# Patient Record
Sex: Female | Born: 1965 | Race: White | Hispanic: No | Marital: Married | Smoking: Never smoker
Health system: Southern US, Community
[De-identification: ages and names within clinical notes are randomized; demographics above are authoritative.]

## PROBLEM LIST (undated history)

## (undated) DIAGNOSIS — I82402 Acute embolism and thrombosis of unspecified deep veins of left lower extremity: Secondary | ICD-10-CM

## (undated) HISTORY — PX: BREAST EXCISIONAL BIOPSY: SUR124

## (undated) HISTORY — PX: THYROID CYST EXCISION: SHX2511

## (undated) HISTORY — PX: TONSILLECTOMY: SUR1361

## (undated) HISTORY — PX: BREAST BIOPSY: SHX20

## (undated) HISTORY — PX: CARPAL TUNNEL RELEASE: SHX101

## (undated) HISTORY — PX: DILATION AND CURETTAGE OF UTERUS: SHX78

## (undated) HISTORY — DX: Acute embolism and thrombosis of unspecified deep veins of left lower extremity: I82.402

---

## 2013-04-07 DIAGNOSIS — IMO0001 Reserved for inherently not codable concepts without codable children: Secondary | ICD-10-CM | POA: Insufficient documentation

## 2013-04-07 DIAGNOSIS — G43909 Migraine, unspecified, not intractable, without status migrainosus: Secondary | ICD-10-CM | POA: Insufficient documentation

## 2018-02-20 HISTORY — PX: VAGINAL HYSTERECTOMY: SUR661

## 2018-07-11 ENCOUNTER — Ambulatory Visit (INDEPENDENT_AMBULATORY_CARE_PROVIDER_SITE_OTHER): Payer: Managed Care, Other (non HMO)

## 2018-07-11 ENCOUNTER — Ambulatory Visit (INDEPENDENT_AMBULATORY_CARE_PROVIDER_SITE_OTHER): Payer: Managed Care, Other (non HMO) | Admitting: Family Medicine

## 2018-07-11 ENCOUNTER — Encounter: Payer: Self-pay | Admitting: Family Medicine

## 2018-07-11 VITALS — BP 138/58 | HR 71 | Ht 67.0 in | Wt 182.0 lb

## 2018-07-11 DIAGNOSIS — G2581 Restless legs syndrome: Secondary | ICD-10-CM

## 2018-07-11 DIAGNOSIS — M797 Fibromyalgia: Secondary | ICD-10-CM | POA: Diagnosis not present

## 2018-07-11 DIAGNOSIS — R0781 Pleurodynia: Secondary | ICD-10-CM

## 2018-07-11 DIAGNOSIS — G43109 Migraine with aura, not intractable, without status migrainosus: Secondary | ICD-10-CM | POA: Diagnosis not present

## 2018-07-11 DIAGNOSIS — A6000 Herpesviral infection of urogenital system, unspecified: Secondary | ICD-10-CM | POA: Insufficient documentation

## 2018-07-11 MED ORDER — ELETRIPTAN HYDROBROMIDE 40 MG PO TABS: 40.0000 mg | ORAL_TABLET | ORAL | 99 refills | Status: DC | PRN

## 2018-07-11 NOTE — Progress Notes (Signed)
Subjective:    Patient ID: Denise Ramirez, female    DOB: 21-Jul-1966, 52 y.o.   MRN: [id number]  HPI 52 yo female is here to estab care.   She has a history of fibromyalgia and ocular migraines.  In fact since moving back to [place] her ocular my back grains have been very poorly controlled.  She has been on lamotrigine 50 mg twice a day for years and just feels like she is at the point where she is not sure that is actually effective.  She has tried some other medications in the past as well.  More recently she is been on several rounds of prednisone tapers for her migraines.  She does use Relpax as needed and says that it works most of the time.  She does need a refill as her current prescription has expired.  She also has a diagnosis of fibromyalgia and is currently on duloxetine 20 mg.  She is taken up to 60 mg previously.  Her husband who is here with her today said that he felt like she felt better when she was taking the 30 mg tab.  She also has some chronic left leg pain.  She actually had an injury to her left leg and after being in a support brace for a couple of days she actually developed a DVT.  She was on birth control at that time.  She was then on Coumadin for 6 months and then came off.  Her contraceptives were discontinued and she never restarted them.  She has never had a recurrent DVT since then.  But unfortunately because of the DVT she never went underwent physical therapy and has had some chronic leg pain since then.    She also has a history of restless leg syndrome and says it always seems to be aggravated more in her left leg compared to her right.  She also has a genital herpes history and says that she rarely breaks out but does use valacyclovir when needed.  She is not on daily prophylaxis.  She also was lifting a table at work and was bending and twisting all at the same time.  She now has right anterior lower rib pain.  She felt a pop when it first occurred  and thinks she may have fractured a rib it feels similar to when she had had a rib fracture previously.  Did happen while at work.  She does have pain with a deep breath but does not feel short of breath in general.   She does report that she is up-to-date on most of her preventative care but does not remember the exact dates.  We should be able to get that information from her stroke medical records which she will have forwarded to our office.  Review of Systems  BP (!) 138/58   Pulse 71   Ht 5\' 7"  (1.702 m)   Wt 182 lb (82.6 kg)   SpO2 100%   BMI 28.51 kg/m     Allergies  Allergen Reactions  . Morphine And Related Nausea And Vomiting    Past Medical History:  Diagnosis Date  . 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 CARPAL TUNNEL RELEASE Right   . DILATION AND CURETTAGE OF UTERUS     x 3  . THYROID CYST EXCISION    . TONSILLECTOMY  age 52  . VAGINAL HYSTERECTOMY  02/2018    Social History   Socioeconomic History  . Marital status: Not on file    Spouse name: Not on file  . Number of children: Not on file  . Years of education: Not on file  . Highest education level: Not on file  Occupational History  . Occupation: Occupational hygienistetail Manager  Social Needs  . Financial resource strain: Not on file  . Food insecurity:    Worry: Not on file    Inability: Not on file  . Transportation needs:    Medical: Not on file    Non-medical: Not on file  Tobacco Use  . Smoking status: Never Smoker  . Smokeless tobacco: Never Used  Substance and Sexual Activity  . Alcohol use: Yes  . Drug use: Never  . Sexual activity: Yes    Partners: Male  Lifestyle  . Physical activity:    Days per week: Not on file    Minutes per session: Not on file  . Stress: Not on file  Relationships  . Social connections:    Talks on phone: Not on file    Gets together: Not on file    Attends religious service: Not on file    Active member of  club or organization: Not on file    Attends meetings of clubs or organizations: Not on file    Relationship status: Not on file  . Intimate partner violence:    Fear of current or ex partner: Not on file    Emotionally abused: Not on file    Physically abused: Not on file    Forced sexual activity: Not on file  Other Topics Concern  . Not on file  Social History Narrative  . Not on file    Family History  Adopted: Yes    Outpatient Encounter Medications as of 07/11/2018  Medication Sig  . acetaminophen (TYLENOL) 500 MG tablet Take 500 mg by mouth as needed.  . DULoxetine (CYMBALTA) 20 MG capsule Take 1 capsule by mouth daily.  Marland Kitchen. eletriptan (RELPAX) 40 MG tablet Take 1 tablet (40 mg total) by mouth as needed for migraine or headache. May repeat in 2 hours if headache persists or recurs.  . Ibuprofen 200 MG CAPS Take by mouth as needed.  . lamoTRIgine (LAMICTAL) 25 MG tablet Take 50 mg by mouth 2 (two) times daily.  . Multiple Vitamin (MULTIVITAMIN) tablet Take 1 tablet by mouth daily.  . pantoprazole (PROTONIX) 20 MG tablet Take 20 mg by mouth 2 (two) times daily.  . Probiotic Product (PROBIOTIC PO) Take by mouth.  . valACYclovir (VALTREX) 1000 MG tablet Take 1,000 mg by mouth as needed.  . [DISCONTINUED] Cholecalciferol (VITAMIN D PO) Take by mouth.  . [DISCONTINUED] Magnesium 500 MG TABS Take 500 mg by mouth 2 (two) times daily.  . [DISCONTINUED] riboflavin (VITAMIN B-2) 100 MG TABS tablet Take by mouth.   No facility-administered encounter medications on file as of 07/11/2018.           Objective:   Physical Exam  Constitutional: She is oriented to person, place, and time. She appears well-developed and well-nourished.  HENT:  Head: Normocephalic and atraumatic.  Eyes: Conjunctivae and EOM are normal.  Cardiovascular: Normal rate, regular rhythm and normal heart sounds.  Pulmonary/Chest: Effort normal and breath sounds normal.  Abdominal:  He does have some  significant tenderness just right under the breast on the right anterior ribs.  And are just a little bit laterally as well.  Neurological: She  is alert and oriented to person, place, and time.  Skin: Skin is warm and dry. No pallor.  Psychiatric: She has a normal mood and affect. Her behavior is normal.  Vitals reviewed.         Assessment & Plan:  Ocular migraines-we will place referral to neurology.  I did go ahead and refill her Relpax today.  Did get some benefit from Botox injections in the past.  The only something she could revisit with neurology.  She did not continue them because of the cost but she did notice some benefit.  Fibromyalgia-we will continue with Cymbalta 20 mg.  Though certainly if at any point she wants to bump her dose up she certainly can.  We discussed in the studies that often reduce pain by about 30% but did not completely eliminate symptoms.  Chronic left leg pain-offered to refer her to 1 of our sports medicine providers for further work-up and treatment options.  Restless leg syndrome-up in the future I will be happy to discuss treatment options.  It sounds like she may have actually tried gabapentin at one time but she might be a good candidate for ropinirole and we discussed that today.  Genital herpes-she does use Relpax as needed.  Anterior rib pain-discussed that if it is a rib fracture that there is not a lot that we can do.  It can take up to 12 weeks to heal.  Because the injury did occur at work she would like to get an x-ray just to confirm.

## 2018-07-11 NOTE — Progress Notes (Signed)
NEUROLOGY CONSULTATION NOTE  Denise Ramirez MRN: [id number] DOB: [birth year]  Referring provider: Nani Gasser, MD Primary care provider: Nani Gasser, MD  Reason for consult:  migraines  HISTORY OF PRESENT ILLNESS: Denise Ramirez is a 52 year old born left-handed female with fibromyalgia and history of left leg DVT who presents for migraines.  History supplemented by referring provider's note.  Onset:  Childhood/toddler Location:  holocephalic Quality:  Normally starts pounding then tender Intensity:  Severe.  She denies new headache, thunderclap headache or severe headache that wakes her from sleep. Aura:  Squiggly lines with peripheral vision loss with pinpoint scotoma, lasts up to 30 minutes followed by headache Prodrome:  no Postdrome:  Hangover effect for 2 days Associated symptoms:  Nausea, vomiting, photophobia, phonophobia, nose runs, paresthesias in face and arms.  She denies associated , unilateral weakness. Duration:  Wakes up with them and resolves in 2-3 hours and returns in afternoon.  Sometimes intractable up to 10 days Frequency:  15 migraine days a month, daily headaches She has intractable migraines Frequency of abortive medication: ibuprofen daily Triggers:  Bright sunlight Exacerbating factors:  Sleep deprivation, too much sleep, light, sounds, used to be menstrual Relieving factors:  Red wine, sometimes eating sweets, eye pillow, cold wash cloth, needs to lay down to the left. Activity:  Cannot function She has prior history of menstrual migraines and exertional headaches.  Regimen:  First thing in morning takes coffee and ibuprofen 400mg .  Then at lunch, will take 2 Tylenol (not effective).  Tries not to take Denise Ramirez at work due to drowsiness.  She will wait until home to take and then goes to sleep. Current NSAIDS:  Ibuprofen 400mg   Current analgesics:  Tylenol Current triptans:  Denise Ramirez 40mg  Current ergotamine: no Current  anti-emetic: no Current muscle relaxants:  no Current anti-anxiolytic:  no Current sleep aide:  no Current Antihypertensive medications:  no Current Antidepressant medications:  Cymbalta 20mg  Current Anticonvulsant medications:  Denise Ramirez 50mg  twice daily Current anti-CGRP:  no Current Vitamins/Herbal/Supplements:  MVI Current Antihistamines/Decongestants:  no Other therapy:  Daith piercing Hormone/birth control:  no  Past NSAIDS:  naproxen Past analgesics:  Fioricet, Excedrin, Excedrin Migraine, tramadol Past abortive triptans:  Sumatriptan tablet, sumatriptan [place], Zomig NS, Maxalt Past abortive ergotamine:  Migranal Past muscle relaxants:  Flexeril Past anti-emetic:  Promethazine Past antihypertensive medications:  propranolol Past antidepressant medications:  Amitriptyline, nortriptyline Past anticonvulsant medications:  Topiramate, Depakote, Keppra Past anti-CGRP:  no Past vitamins/Herbal/Supplements:  Magnesium 500mg , riboflavin 100mg  Past antihistamines/decongestants:  no Other past therapies:  Botox (1 round, then she moved out of area), prednisone tapers for intractable headache  Caffeine:  2 cups of coffee in AM Alcohol:  Occasional wine Smoker:  no Diet:  At least 8 16.5 oz bottles water daily, Caffeine-free Coke Exercise:  2 days a week Depression:  no; Anxiety:  no Other pain:  Fibromyalgia, neck pain Sleep hygiene:  Good.  She used to snore and was told she may have had sleep apnea but she lost 50 lbs before testing.  3 pm she starts feeling sleepy. Family history of headache: Adopted.  PAST MEDICAL HISTORY: Past Medical History:  Diagnosis Date  . Left leg DVT (HCC)    trauma and OCP    PAST SURGICAL HISTORY: Past Surgical History:  Procedure Laterality Date  . CARPAL TUNNEL RELEASE Right   . DILATION AND CURETTAGE OF UTERUS     x 3  . THYROID CYST EXCISION    . TONSILLECTOMY  age 52  . VAGINAL HYSTERECTOMY  02/2018    MEDICATIONS: Current  Outpatient Medications on File Prior to Visit  Medication Sig Dispense Refill  . acetaminophen (TYLENOL) 500 MG tablet Take 500 mg by mouth as needed.    . Denise Ramirez (CYMBALTA) 20 MG capsule Take 1 capsule by mouth daily.    Marland Kitchen. eletriptan (Denise Ramirez) 40 MG tablet Take 1 tablet (40 mg total) by mouth as needed for migraine or headache. May repeat in 2 hours if headache persists or recurs. 10 tablet PRN  . Ibuprofen 200 MG CAPS Take by mouth as needed.    . Denise Ramirez (LAMICTAL) 25 MG tablet Take 50 mg by mouth 2 (two) times daily.    . Multiple Vitamin (MULTIVITAMIN) tablet Take 1 tablet by mouth daily.    . pantoprazole (PROTONIX) 20 MG tablet Take 20 mg by mouth 2 (two) times daily.    . Probiotic Product (PROBIOTIC PO) Take by mouth.    . valACYclovir (VALTREX) 1000 MG tablet Take 1,000 mg by mouth as needed.     No current facility-administered medications on file prior to visit.     ALLERGIES: Allergies  Allergen Reactions  . Morphine And Related Nausea And Vomiting    FAMILY HISTORY: Family History  Adopted: Yes   SOCIAL HISTORY: Social History   Socioeconomic History  . Marital status: Not on file    Spouse name: Not on file  . Number of children: Not on file  . Years of education: Not on file  . Highest education level: Not on file  Occupational History  . Occupation: Occupational hygienistetail Manager  Social Needs  . Financial resource strain: Not on file  . Food insecurity:    Worry: Not on file    Inability: Not on file  . Transportation needs:    Medical: Not on file    Non-medical: Not on file  Tobacco Use  . Smoking status: Never Smoker  . Smokeless tobacco: Never Used  Substance and Sexual Activity  . Alcohol use: Yes  . Drug use: Never  . Sexual activity: Yes    Partners: Male  Lifestyle  . Physical activity:    Days per week: Not on file    Minutes per session: Not on file  . Stress: Not on file  Relationships  . Social connections:    Talks on phone: Not on file     Gets together: Not on file    Attends religious service: Not on file    Active member of club or organization: Not on file    Attends meetings of clubs or organizations: Not on file    Relationship status: Not on file  . Intimate partner violence:    Fear of current or ex partner: Not on file    Emotionally abused: Not on file    Physically abused: Not on file    Forced sexual activity: Not on file  Other Topics Concern  . Not on file  Social History Narrative  . Not on file    REVIEW OF SYSTEMS: Constitutional: No fevers, chills, or sweats, no generalized fatigue, change in appetite Eyes: No visual changes, double vision, eye pain Ear, nose and throat: No hearing loss, ear pain, nasal congestion, sore throat Cardiovascular: No chest pain, palpitations Respiratory:  No shortness of breath at rest or with exertion, wheezes GastrointestinaI: No nausea, vomiting, diarrhea, abdominal pain, fecal incontinence Genitourinary:  No dysuria, urinary retention or frequency Musculoskeletal:  Neck pain, back pain Integumentary: No rash,  pruritus, skin lesions Neurological: as above Psychiatric: No depression, anxiety Endocrine: No palpitations, fatigue, diaphoresis, mood swings, change in appetite, change in weight, increased thirst Hematologic/Lymphatic:  No purpura, petechiae. Allergic/Immunologic: no itchy/runny eyes, nasal congestion, recent allergic reactions, rashes  PHYSICAL EXAM: Blood pressure 122/84, pulse 64, height 5\' 7"  (1.702 m), weight 182 lb (82.6 kg), SpO2 99 %. General: No acute distress.  Patient appears well-groomed.   Head:  Normocephalic/atraumatic Eyes:  fundi examined but not visualized Neck: supple, mild paraspinal tenderness, full range of motion Back: No paraspinal tenderness Heart: regular rate and rhythm Lungs: Clear to auscultation bilaterally. Vascular: No carotid bruits. Neurological Exam: Mental status: alert and oriented to person, place, and time,  recent and remote memory intact, fund of knowledge intact, attention and concentration intact, speech fluent and not dysarthric, language intact. Cranial nerves: CN I: not tested CN II: pupils equal, round and reactive to light, visual fields intact CN III, IV, VI:  full range of motion, no nystagmus, no ptosis CN V: facial sensation intact CN VII: upper and lower face symmetric CN VIII: hearing intact CN IX, X: gag intact, uvula midline CN XI: sternocleidomastoid and trapezius muscles intact CN XII: tongue midline Bulk & Tone: normal, no fasciculations. Motor:  5/5 throughout  Sensation:  temperature and vibration sensation intact.   Deep Tendon Reflexes:  2+ throughout, toes downgoing.   Finger to nose testing:  Without dysmetria.  Heel to shin:  Without dysmetria.  Gait:  Normal station and stride.  Romberg negative.  IMPRESSION: Chronic migraine with aura, intractable Medication-overuse headache  PLAN: 1.  Start Aimovig 70mg  monthly (first injection in office) 2.  For abortive therapy, will try Cambia.  Denise Ramirez/Tylenol as next line. 3.  Stop ibuprofen:  Limit use of pain relievers to no more than 2 days out of week to prevent risk of rebound or medication-overuse headache. 4.  Keep headache diary 5.  Will taper off of Denise Ramirez as scheduled in AVS. 6.  Exercise, hydrate, caffeine cessation 7.  Follow up in 3 to 4 months.  Thank you for allowing me to take part in the care of this patient.  45 minutes spent face to face with patient, over 50% spent discussing management  Shon Millet, DO  CC: Denise Gasser, MD

## 2018-07-12 ENCOUNTER — Telehealth: Payer: Self-pay | Admitting: *Deleted

## 2018-07-12 ENCOUNTER — Encounter: Payer: Self-pay | Admitting: Neurology

## 2018-07-12 ENCOUNTER — Ambulatory Visit: Payer: Managed Care, Other (non HMO) | Admitting: Neurology

## 2018-07-12 VITALS — BP 122/84 | HR 64 | Ht 67.0 in | Wt 182.0 lb

## 2018-07-12 DIAGNOSIS — G4440 Drug-induced headache, not elsewhere classified, not intractable: Secondary | ICD-10-CM | POA: Diagnosis not present

## 2018-07-12 DIAGNOSIS — G43109 Migraine with aura, not intractable, without status migrainosus: Secondary | ICD-10-CM | POA: Diagnosis not present

## 2018-07-12 MED ORDER — ERENUMAB-AOOE 70 MG/ML ~~LOC~~ SOAJ: 70.0000 mg | SUBCUTANEOUS | 0 refills | Status: DC

## 2018-07-12 MED ORDER — DICLOFENAC POTASSIUM(MIGRAINE) 50 MG PO PACK: 50.0000 mg | PACK | ORAL | 0 refills | Status: DC

## 2018-07-12 NOTE — Patient Instructions (Signed)
Migraine Recommendations: 1.  Start Aimovig 70mg  monthly.   2.  Take Cambia as directed to treat acute headache.May use Tylenol and Relpax as next line if needed. 3.  STOP IBUPROFEN.  Limit use of pain relievers to no more than 2 days out of the week.  These medications include acetaminophen, ibuprofen, triptans and narcotics.  This will help reduce risk of rebound headaches. 4.  Be aware of common food triggers such as processed sweets, processed foods with nitrites (such as deli meat, hot dogs, sausages), foods with MSG, alcohol (such as wine), chocolate, certain cheeses, certain fruits (dried fruits, bananas, some citrus fruit), vinegar, diet soda. 4.  Avoid caffeine 5.  Routine exercise 6.  Proper sleep hygiene 7.  Stay adequately hydrated with water 8.  Keep a headache diary. 9.  Maintain proper stress management. 10.  Do not skip meals. 11.  Consider supplements:  Magnesium citrate 400mg  to 600mg  daily, riboflavin 400mg , Coenzyme Q 10 100mg  three times daily 12.  We will taper off of lamotrigine:   Take 25mg  twice daily for 2 weeks   Then 25mg  at bedtime for 2 weeks   Then stop 13.  Follow up in 3 to 4 months.

## 2018-07-12 NOTE — Telephone Encounter (Signed)
Spoke w/pt she stated that she was unable to sign in to her my chart account and when she contacted the help desk they advised her to call our office. She was on her way to an appt I told her that I would copy and paste the code that is in her account and when she calls back she can get this and try it. The code is valid until 08/25/2018.   activation code: [email or web address]  .Denise Ramirez, Denise Ramirez Lynetta, CMA

## 2018-08-08 ENCOUNTER — Other Ambulatory Visit: Payer: Self-pay

## 2018-08-08 MED ORDER — ERENUMAB-AOOE 70 MG/ML ~~LOC~~ SOAJ: 70.0000 mg | SUBCUTANEOUS | 11 refills | Status: DC

## 2018-08-09 NOTE — Progress Notes (Signed)
Submitted on cover my meds. Aimovig is not covered Ajovy and Emgality are covered. Pt was provided with a co-pay card when sample was given. Called Pt and advised her.

## 2018-08-26 ENCOUNTER — Telehealth: Payer: Self-pay

## 2018-08-26 NOTE — Telephone Encounter (Signed)
Rcvd call from Sue Lush from [employer] ([employer]) [phone], [employer] was denied for this Pt. I will research other options and call her back

## 2018-08-27 ENCOUNTER — Other Ambulatory Visit: Payer: Self-pay | Admitting: *Deleted

## 2018-08-27 MED ORDER — DULOXETINE HCL 20 MG PO CPEP: 20.0000 mg | ORAL_CAPSULE | Freq: Every day | ORAL | 0 refills | Status: DC

## 2018-08-29 ENCOUNTER — Telehealth: Payer: Self-pay | Admitting: Neurology

## 2018-08-29 NOTE — Telephone Encounter (Signed)
[employer] called in wanting to check on what needed to be done next with steps for this patient. Please call her back at [phone] Thanks!

## 2018-10-11 NOTE — Progress Notes (Signed)
Attempted to initiate PA for pt's Aimovig and received the following message.    The patient's drug benefit plan provides coverage for other drugs which may be considered for treating your patient. Can your patient's treatment be switched to a formulary drug? [If yes, then provide your patient with a new prescription for a preferred product: Ajovy or Emgality.] Available Formulary Alternatives: Ajovy or Emgality  No documentation of tried/failed/intolerence of preferred drug.    Dr. Everlena CooperJaffe - would you consider switching to preferred drug?

## 2018-10-14 MED ORDER — GALCANEZUMAB-GNLM 120 MG/ML ~~LOC~~ SOAJ: 1.0000 mL | SUBCUTANEOUS | 11 refills | Status: DC

## 2018-10-14 NOTE — Addendum Note (Signed)
Addended by: Horatio PelOSTELLO, MEAGEN on: 10/14/2018 11:24 AM   Modules accepted: Orders

## 2018-10-14 NOTE — Progress Notes (Signed)
Emgality 120mg /mL #1 with 11 refills Sig = Inject 1 mLSUBQ every 30 (thirty) days.  [facility] [place]

## 2018-10-14 NOTE — Progress Notes (Signed)
We can try Emgality.

## 2018-11-06 ENCOUNTER — Ambulatory Visit: Payer: Managed Care, Other (non HMO) | Admitting: Neurology

## 2018-11-28 ENCOUNTER — Other Ambulatory Visit: Payer: Self-pay | Admitting: *Deleted

## 2018-11-28 DIAGNOSIS — M797 Fibromyalgia: Secondary | ICD-10-CM

## 2018-11-28 MED ORDER — DULOXETINE HCL 20 MG PO CPEP: 20.0000 mg | ORAL_CAPSULE | Freq: Every day | ORAL | 0 refills | Status: DC

## 2018-12-09 ENCOUNTER — Encounter: Payer: Self-pay | Admitting: Neurology

## 2019-02-22 ENCOUNTER — Other Ambulatory Visit: Payer: Self-pay | Admitting: Family Medicine

## 2019-02-22 DIAGNOSIS — M797 Fibromyalgia: Secondary | ICD-10-CM

## 2019-02-25 ENCOUNTER — Encounter: Payer: Self-pay | Admitting: Family Medicine

## 2019-02-25 ENCOUNTER — Telehealth (INDEPENDENT_AMBULATORY_CARE_PROVIDER_SITE_OTHER): Payer: Managed Care, Other (non HMO) | Admitting: Family Medicine

## 2019-02-25 VITALS — BP 110/60 | HR 72 | Ht 67.0 in | Wt 182.0 lb

## 2019-02-25 DIAGNOSIS — F5102 Adjustment insomnia: Secondary | ICD-10-CM

## 2019-02-25 DIAGNOSIS — M797 Fibromyalgia: Secondary | ICD-10-CM

## 2019-02-25 DIAGNOSIS — G43809 Other migraine, not intractable, without status migrainosus: Secondary | ICD-10-CM | POA: Diagnosis not present

## 2019-02-25 MED ORDER — CYCLOBENZAPRINE HCL 5 MG PO TABS: 2.5000 mg | ORAL_TABLET | Freq: Every evening | ORAL | 1 refills | Status: DC | PRN

## 2019-02-25 MED ORDER — DULOXETINE HCL 20 MG PO CPEP: 20.0000 mg | ORAL_CAPSULE | Freq: Every day | ORAL | 2 refills | Status: DC

## 2019-02-25 NOTE — Progress Notes (Signed)
Virtual Visit via Video Note  I connected with Denise Ramirez on 02/25/19 at  9:50 AM EDT by a video enabled telemedicine application and verified that I am speaking with the correct person using two identifiers.   I discussed the limitations of evaluation and management by telemedicine and the availability of in person appointments. The patient expressed understanding and agreed to proceed.  Patient was at work and I was in the office for this visit.  Subjective:    CC: Fibro f/u   HPI: Follow-up fibromyalgia -she is currently on duloxetine 20 mg daily.  She does feel like it works well as long as she does not miss a dose but wanted to see if there are any other options. Was on gabapentin years ago for RLS and didn't really work for her. She did take Lyrica at one point as well. Had a flare as well.  Says her schedule was off and feels like that was triggering.  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  They are not opened to the public yet.  Her migraines have been flaring last week as well. Says her sleep has been off and feels that has been triggering her migraines.  Overall though she is done fantastic since being on Aimovig.  She follows with Dr. Everlena Cooper with neurology.  Has been having some left leg pain again where she had a blood clot years ago.  Always stay sore but has been keeping her night lately.   Past medical history, Surgical history, Family history not pertinant except as noted below, Social history, Allergies, and medications have been entered into the medical record, reviewed, and corrections made.   Review of Systems: No fevers, chills, night sweats, weight loss, chest pain, or shortness of breath.   Objective:    General: Speaking clearly in complete sentences without any shortness of breath.  Alert and oriented x3.  Normal judgment. No apparent acute distress.  Well-groomed.    Impression and Recommendations:    Fibromyalgia  -discussed options and additional treatment options it sounds like she is Denise Ramirez tried several in the past.  She actually really does do well with Cymbalta overall she just does not like how it makes her feel when she misses a dose.  We will continue with Cymbalta 20 mg.  She did not want to adjust the dose.  We did discussed maybe a trial of a low-dose muscle relaxer at bedtime to help with some of those more sleepless nights to just use as needed.  She said she took Flexeril years ago and does not remember any negative side effects.  Will send over prescription to the pharmacy.  Migraine HA - she sees Dr. Everlena Cooper and is on Aimovig.  Overall she is been doing really well on the Aimovig.  Has had some recent flares over the last week with her migraines but she thinks secondary to sleep issues.  She says she is also interested in maybe trying a low-carb diet though she is already a vegetarian.  Insomnia-I think it is temporary probably because of schedule change in stressors with COVID.  Again we will try low-dose of Flexeril because of her fibro-at night to see if this is helpful.      I discussed the assessment and treatment plan with the patient. The patient was provided an opportunity to ask questions and all were answered. The patient agreed with the plan and demonstrated an understanding of the instructions.  The patient was advised to call back or seek an in-person evaluation if the symptoms worsen or if the condition fails to improve as anticipated.   [name], MD

## 2019-02-25 NOTE — Progress Notes (Signed)
Pt wanted to know if there was anything else that she can take for her Fibromyalgia other than the Cymbalta? She does well on this as long as she doesn't miss any doses. But wanted to know Dr. Shelah Lewandowsky thoughts about this.Marland KitchenMarland KitchenHeath Gold, CMA

## 2019-04-08 NOTE — Progress Notes (Signed)
Aimovig 70mg  approved through 04/07/2020.

## 2019-04-08 NOTE — Progress Notes (Signed)
Initiated on Cover My MedsKey: Katy Fitch   If Tommie Sams has not responded to your request within 24 hours, contact [place] at [phone]

## 2019-08-08 ENCOUNTER — Other Ambulatory Visit: Payer: Self-pay

## 2019-08-08 ENCOUNTER — Telehealth: Payer: Self-pay | Admitting: Neurology

## 2019-08-08 ENCOUNTER — Other Ambulatory Visit: Payer: Self-pay | Admitting: Neurology

## 2019-08-08 NOTE — Telephone Encounter (Signed)
Requested Prescriptions   Pending Prescriptions Disp Refills  . AIMOVIG 70 MG/ML SOAJ [Pharmacy Med Name: AIMOVIG 70 MG/ML AUTOINJECTOR]  7    Sig: INJECT 70 MG INTO THE SKIN EVERY 30 (THIRTY) DAYS.   Rx last filled:08/08/18 #1 11 REFILLS  Pt last seen:07/12/18  Follow up appt scheduled:NONE  DENIED PATIENT NEEDS APPT

## 2019-08-08 NOTE — Telephone Encounter (Signed)
Patient called to make a follow up appointment and to get a refill on her Aimovig medication. She uses [facility]. Thanks

## 2019-08-08 NOTE — Telephone Encounter (Signed)
Patient last visit was 08/08/18 and Aimovig 70mg  was ordered. She called for a refill and needed to make an appt which she did for next Friday 10/23.  Called patient back and left message (ok per DPR) to see if she is able to wait until appt with MD on Friday for Aimovig in case he wants to increase the dosage. Left on the message if she has to have the med next week prior to appt to call us back and will ask MD if ok for one more 70mg  injection prior to [facility] appt.

## 2019-08-11 ENCOUNTER — Other Ambulatory Visit: Payer: Self-pay | Admitting: Neurology

## 2019-08-11 MED ORDER — AIMOVIG 70 MG/ML ~~LOC~~ SOAJ: 70.0000 mg | SUBCUTANEOUS | 0 refills | Status: DC

## 2019-08-11 NOTE — Telephone Encounter (Signed)
Requested Prescriptions   Pending Prescriptions Disp Refills  . Erenumab-aooe (AIMOVIG) 70 MG/ML SOAJ 1 pen 11    Sig: Inject 70 mg into the skin every 30 (thirty) days.   Rx last filled:08/08/18 #1 11 refills  Pt last seen: 07/12/18  Follow up appt scheduled:08/15/19

## 2019-08-11 NOTE — Telephone Encounter (Signed)
Pt last seen over a year ago appt coming up 1 pen sent to pharmacy until patient is seen in office.

## 2019-08-11 NOTE — Telephone Encounter (Signed)
Patient called and requested Aimovig 70 MG be called into the pharmacy as soon as possible. She said she is due to take her next injection tomorrow, 08/12/19. She does not want to wait until her appointment on Friday, 08/15/19.  [facility]  See telephone note from 08/08/19 for more information.

## 2019-08-12 ENCOUNTER — Telehealth: Payer: Self-pay | Admitting: Neurology

## 2019-08-12 NOTE — Telephone Encounter (Signed)
Left message with after hour service 08-12-19 @ 12:41 pm  Caller states that she needs RX sent to the pharmacy ASAP today she is needing her Aimovig today she left a message on Friday

## 2019-08-12 NOTE — Telephone Encounter (Signed)
Called patient she was informed that Rx was sent to pharmacy to contact pharmacy.

## 2019-08-12 NOTE — Progress Notes (Signed)
NEUROLOGY FOLLOW UP OFFICE NOTE  Denise Ramirez [id number]  HISTORY OF PRESENT ILLNESS: Denise Ramirez is a 53 year old born left-handed female with fibromyalgia and history of left leg DVT who follows up for chronic migraine.  UPDATE: Wakes up with migraine.  She still uses Migranal. Causes burning for 15 minutes but migraine aborts quickly. Intensity:  severe Duration:  1/2 to 1 day Frequency:  3 days a month Rescue protocol:  2 ibuprofen with coffee; after 60 to 90 minutes will take Migranal (usually breaks immediately).  Will take ibuprofen an hour later (rarely) Frequency of abortive medication: 3 days a week Current NSAIDS:  none Current analgesics:  Tylenol Current triptans:  Relpax 40mg  Current ergotamine: Migranal Current anti-emetic: no Current muscle relaxants:  Flexeril Current anti-anxiolytic:  no Current sleep aide:  no Current Antihypertensive medications:  no Current Antidepressant medications:  Cymbalta 20mg  Current Anticonvulsant medications:  Lamotrigine 50mg  twice daily Current anti-CGRP:   Aimovig 70 mg Current Vitamins/Herbal/Supplements:  MVI Current Antihistamines/Decongestants:  no Other therapy:  Daith piercing Hormone/birth control:  no  Caffeine:  2 cups of coffee in AM Alcohol:  Occasional wine Smoker:  no Diet:  At least 8 16.5 oz bottles water daily, Caffeine-free Coke Exercise:  2 days a week Depression:  no; Anxiety:  no Other pain:  Fibromyalgia, neck pain Sleep hygiene:  Good.  She used to snore and was told she may have had sleep apnea but she lost 50 lbs before testing.  3 pm she starts feeling sleepy.  HISTORY: Onset: Childhood/toddler Location:  holocephalic Quality:  Normally starts pounding then tender Initial intensity:  Severe.  She denies new headache, thunderclap headache or severe headache that wakes her from sleep. Aura:  Squiggly lines with peripheral vision loss with pinpoint scotoma, lasts up to 30 minutes  followed by headache Prodrome:  no Postdrome:  Hangover effect for 2 days Associated symptoms: Nausea, vomiting, photophobia, phonophobia, nose runs, paresthesias in face and arms.  She denies associated , unilateral weakness. Initial duration:  Wakes up with them and resolves in 2-3 hours and returns in afternoon.  Sometimes intractable up to 10 days Initial Frequency:  15 migraine days a month, daily headaches She has intractable migraines Frequency of abortive medication: ibuprofen daily Triggers: Bright sunlight, sleep deprivation, formerly menses Relieving factors: Red wine, sometimes eating sweets, eye pillow, cold wash cloth, needs to lay down to the left. Activity:  Cannot function She has prior history of menstrual migraines and exertional headaches.   Past NSAIDS:  naproxen, ibuprofen, Cambia. Insurance denied Sprix NS Past analgesics:  Fioricet, Excedrin, Excedrin Migraine, tramadol Past abortive triptans:  Sumatriptan tablet, sumatriptan Fithian, Zomig NS, Maxalt Past abortive ergotamine:  none Past muscle relaxants:  Flexeril Past anti-emetic:  Promethazine Past antihypertensive medications:  propranolol Past antidepressant medications:  Amitriptyline, nortriptyline Past anticonvulsant medications:  Topiramate, Depakote, Keppra Past anti-CGRP:  no Past vitamins/Herbal/Supplements:  Magnesium 500mg , riboflavin 100mg  Past antihistamines/decongestants:  no Other past therapies:  Botox (1 round, then she moved out of area), prednisone tapers for intractable headache, coffee   Family history of headache: Adopted.  PAST MEDICAL HISTORY: Past Medical History:  Diagnosis Date  . Left leg DVT (HCC)    trauma and OCP    MEDICATIONS: Current Outpatient Medications on File Prior to Visit  Medication Sig Dispense Refill  . acetaminophen (TYLENOL) 500 MG tablet Take 500 mg by mouth as needed.    . cyclobenzaprine (FLEXERIL) 5 MG tablet Take 0.5-1 tablets (2.5-5  mg total) by  mouth at bedtime as needed for muscle spasms. 30 tablet 1  . DULoxetine (CYMBALTA) 20 MG capsule Take 1 capsule (20 mg total) by mouth daily. 90 capsule 2  . eletriptan (RELPAX) 40 MG tablet Take 1 tablet (40 mg total) by mouth as needed for migraine or headache. May repeat in 2 hours if headache persists or recurs. 10 tablet PRN  . Erenumab-aooe (AIMOVIG) 70 MG/ML SOAJ Inject 70 mg into the skin every 30 (thirty) days. 1 pen 0  . Ibuprofen 200 MG CAPS Take by mouth as needed.    . Multiple Vitamin (MULTIVITAMIN) tablet Take 1 tablet by mouth daily.    . pantoprazole (PROTONIX) 20 MG tablet Take 20 mg by mouth 2 (two) times daily.    . Probiotic Product (PROBIOTIC PO) Take by mouth.    . valACYclovir (VALTREX) 1000 MG tablet Take 1,000 mg by mouth as needed.     No current facility-administered medications on file prior to visit.     ALLERGIES: Allergies  Allergen Reactions  . Morphine And Related Nausea And Vomiting    FAMILY HISTORY: Family History  Adopted: Yes   SOCIAL HISTORY: Social History   Socioeconomic History  . Marital status: Unknown    Spouse name: Matt  . Number of children: 3  . Years of education: Not on file  . Highest education level: Associate degree: academic program  Occupational History  . Occupation: Occupational hygienist  Social Needs  . Financial resource strain: Not on file  . Food insecurity    Worry: Not on file    Inability: Not on file  . Transportation needs    Medical: Not on file    Non-medical: Not on file  Tobacco Use  . Smoking status: Never Smoker  . Smokeless tobacco: Never Used  Substance and Sexual Activity  . Alcohol use: Yes  . Drug use: Never  . Sexual activity: Yes    Partners: Male  Lifestyle  . Physical activity    Days per week: Not on file    Minutes per session: Not on file  . Stress: Not on file  Relationships  . Social Musician on phone: Not on file    Gets together: Not on file    Attends religious  service: Not on file    Active member of club or organization: Not on file    Attends meetings of clubs or organizations: Not on file    Relationship status: Not on file  . Intimate partner violence    Fear of current or ex partner: Not on file    Emotionally abused: Not on file    Physically abused: Not on file    Forced sexual activity: Not on file  Other Topics Concern  . Not on file  Social History Narrative   Patient was born left-handed. She was trained in parochial school to use her right hand. She uses her left hand to eat. She lives with her husband, they are closing on a 2 story house, staying with relatives for the time being. She drinks  2 cups of coffee a day, and rare soda. She participates in weight training twice a week.    REVIEW OF SYSTEMS: Constitutional: No fevers, chills, or sweats, no generalized fatigue, change in appetite Eyes: No visual changes, double vision, eye pain Ear, nose and throat: No hearing loss, ear pain, nasal congestion, sore throat Cardiovascular: No chest pain, palpitations Respiratory:  No shortness of  breath at rest or with exertion, wheezes GastrointestinaI: No nausea, vomiting, diarrhea, abdominal pain, fecal incontinence Genitourinary:  No dysuria, urinary retention or frequency Musculoskeletal:  No neck pain, back pain Integumentary: No rash, pruritus, skin lesions Neurological: as above Psychiatric: No depression, insomnia, anxiety Endocrine: No palpitations, fatigue, diaphoresis, mood swings, change in appetite, change in weight, increased thirst Hematologic/Lymphatic:  No purpura, petechiae. Allergic/Immunologic: no itchy/runny eyes, nasal congestion, recent allergic reactions, rashes  PHYSICAL EXAM: Blood pressure (!) 111/59, pulse 72, height 5\' 8"  (1.727 m), weight 194 lb (88 kg), SpO2 97 %. General: No acute distress.  Patient appears well-groomed.   Head:  Normocephalic/atraumatic Eyes:  Fundi examined but not visualized Neck:  supple, no paraspinal tenderness, full range of motion Heart:  Regular rate and rhythm Lungs:  Clear to auscultation bilaterally Back: No paraspinal tenderness Neurological Exam: alert and oriented to person, place, and time. Attention span and concentration intact, recent and remote memory intact, fund of knowledge intact.  Speech fluent and not dysarthric, language intact.  CN II-XII intact. Bulk and tone normal, muscle strength 5/5 throughout.  Sensation to light touch, temperature and vibration intact.  Deep tendon reflexes 2+ throughout, toes downgoing.  Finger to nose and heel to shin testing intact.  Gait normal, Romberg negative.  IMPRESSION: Migraine without aura, without status migrainosus, not intractable.  Improved.  PLAN: 1.  For preventative management, Aimovig 70mg  monthly 2.  For abortive therapy, Migranal and/or ibuprofen.  Instructed her to stop eletriptan and explained that ergotamines and triptans are not to be taken within 24 hours of each other.  We went over instructions on how to administer Migranal to minimize discomfort. 3.  Limit use of pain relievers to no more than 2 days out of week to prevent risk of rebound or medication-overuse headache. 4.  Keep headache diary 5.  Exercise, hydration, caffeine cessation, sleep hygiene, monitor for and avoid triggers 6.  Consider:  magnesium citrate 400mg  daily, riboflavin 400mg  daily, and coenzyme Q10 100mg  three times daily 7. Follow up one year.   Shon MilletAdam Trai Ells, DO  CC: Nani Gasseratherine Metheney, MD

## 2019-08-12 NOTE — Telephone Encounter (Signed)
Requested Prescriptions   Pending Prescriptions Disp Refills  . Erenumab-aooe (AIMOVIG) 70 MG/ML SOAJ 1 pen 0    Sig: Inject 70 mg into the skin every 30 (thirty) days.   Rx was sent to pharmacy on yesterday   Outpatient Medication Detail   Disp Refills Start End   Erenumab-aooe (AIMOVIG) 70 MG/ML SOAJ 1 pen 0 08/11/2019    Sig - Route: Inject 70 mg into the skin every 30 (thirty) days. - Subcutaneous   Sent to pharmacy as: [name] 70 MG/ML Solution Auto-injector   E-Prescribing Status: Receipt confirmed by pharmacy (08/11/2019 3:41 PM EDT)    [id number] [place] [place]. AT [place] #:  [id number]  Pharmacy Comments: --

## 2019-08-15 ENCOUNTER — Other Ambulatory Visit: Payer: Self-pay

## 2019-08-15 ENCOUNTER — Ambulatory Visit: Payer: Managed Care, Other (non HMO) | Admitting: Neurology

## 2019-08-15 ENCOUNTER — Encounter: Payer: Self-pay | Admitting: Neurology

## 2019-08-15 VITALS — BP 111/59 | HR 72 | Ht 68.0 in | Wt 194.0 lb

## 2019-08-15 DIAGNOSIS — G43009 Migraine without aura, not intractable, without status migrainosus: Secondary | ICD-10-CM

## 2019-08-15 NOTE — Patient Instructions (Signed)
1.  Continue Aimovig every month 2.  Stop the eletriptan.  When you get a migraine, use Migranal spray or ibuprofen 3.  Follow up in one year or as needed

## 2019-09-03 ENCOUNTER — Ambulatory Visit: Payer: Managed Care, Other (non HMO) | Admitting: Family Medicine

## 2019-09-04 ENCOUNTER — Other Ambulatory Visit: Payer: Self-pay

## 2019-09-04 ENCOUNTER — Ambulatory Visit (INDEPENDENT_AMBULATORY_CARE_PROVIDER_SITE_OTHER): Payer: Managed Care, Other (non HMO) | Admitting: Family Medicine

## 2019-09-04 ENCOUNTER — Encounter: Payer: Self-pay | Admitting: Family Medicine

## 2019-09-04 VITALS — BP 130/71 | HR 82 | Ht 68.0 in | Wt 192.0 lb

## 2019-09-04 DIAGNOSIS — M797 Fibromyalgia: Secondary | ICD-10-CM | POA: Diagnosis not present

## 2019-09-04 DIAGNOSIS — Z1231 Encounter for screening mammogram for malignant neoplasm of breast: Secondary | ICD-10-CM

## 2019-09-04 DIAGNOSIS — M545 Low back pain, unspecified: Secondary | ICD-10-CM | POA: Insufficient documentation

## 2019-09-04 DIAGNOSIS — Z79899 Other long term (current) drug therapy: Secondary | ICD-10-CM

## 2019-09-04 DIAGNOSIS — Z13220 Encounter for screening for lipoid disorders: Secondary | ICD-10-CM | POA: Diagnosis not present

## 2019-09-04 DIAGNOSIS — G43809 Other migraine, not intractable, without status migrainosus: Secondary | ICD-10-CM

## 2019-09-04 NOTE — Assessment & Plan Note (Signed)
He is actually doing really well on the Aimovig.  She has been on it for little over a year now and says that this is the best she is ever done with her migraines.  She follows with Dr. Tomi Likens.

## 2019-09-04 NOTE — Progress Notes (Signed)
Established Patient Office Visit  Subjective:  Patient ID: Denise Ramirez, female    DOB: [birth year]  Age: 53 y.o. MRN: [id number]  CC:  Chief Complaint  Patient presents with  . Fibromyalgia    HPI BRUCE MAYERS presents for   Follow-up fibromyalgia-currently on Cymbalta.  Overall she is doing well.  More recently she has been just struggling with leg pain.  She feels like she wears good supportive shoe wear to work.  But she is having problems and tightness with her IT band.  She is been trying to do band exercises she also had some calf pain.  She has not tried the Flexeril yet but she does have a prescription for it.  Migraine headaches-doing really well on Aimovig she just saw Dr. Tomi Likens for her yearly follow-up.  They refilled her medication and she says made a big difference in her quality of life.  He still struggles some with her low back pain.  She was trying to do yoga for a while but felt like it was actually exacerbating her pain.  Declines flu vaccine today.  Past Medical History:  Diagnosis Date  . 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 CARPAL TUNNEL RELEASE Right   . DILATION AND CURETTAGE OF UTERUS     x 3  . THYROID CYST EXCISION    . TONSILLECTOMY  age 22  . VAGINAL HYSTERECTOMY  02/2018    Family History  Adopted: Yes    Social History   Socioeconomic History  . Marital status: Married    Spouse name: Matt  . Number of children: 3  . Years of education: Not on file  . Highest education level: Associate degree: academic program  Occupational History  . Occupation: Scientist, research (medical)  Social Needs  . Financial resource strain: Not on file  . Food insecurity    Worry: Not on file    Inability: Not on file  . Transportation needs    Medical: Not on file    Non-medical: Not on file  Tobacco Use  . Smoking status: Never Smoker  . Smokeless tobacco: Never Used  Substance and  Sexual Activity  . Alcohol use: Yes  . Drug use: Never  . Sexual activity: Yes    Partners: Male  Lifestyle  . Physical activity    Days per week: Not on file    Minutes per session: Not on file  . Stress: Not on file  Relationships  . Social Herbalist on phone: Not on file    Gets together: Not on file    Attends religious service: Not on file    Active member of club or organization: Not on file    Attends meetings of clubs or organizations: Not on file    Relationship status: Not on file  . Intimate partner violence    Fear of current or ex partner: Not on file    Emotionally abused: Not on file    Physically abused: Not on file    Forced sexual activity: Not on file  Other Topics Concern  . Not on file  Social History Narrative   Patient was born left-handed. She was trained in parochial school to use her right hand. She uses her left hand to eat. She lives with her husband, they are closing on a 2 story house, staying with relatives for the time being.  She drinks  2 cups of coffee a day, and rare soda. She participates in weight training twice a week.    Outpatient Medications Prior to Visit  Medication Sig Dispense Refill  . acetaminophen (TYLENOL) 500 MG tablet Take 500 mg by mouth as needed.    . cyclobenzaprine (FLEXERIL) 5 MG tablet Take 0.5-1 tablets (2.5-5 mg total) by mouth at bedtime as needed for muscle spasms. 30 tablet 1  . DULoxetine (CYMBALTA) 20 MG capsule Take 1 capsule (20 mg total) by mouth daily. 90 capsule 2  . Erenumab-aooe (AIMOVIG) 70 MG/ML SOAJ Inject 70 mg into the skin every 30 (thirty) days. 1 pen 0  . Ibuprofen 200 MG CAPS Take by mouth as needed.    . Multiple Vitamin (MULTIVITAMIN) tablet Take 1 tablet by mouth daily.    . pantoprazole (PROTONIX) 20 MG tablet Take 20 mg by mouth 2 (two) times daily.    . Probiotic Product (PROBIOTIC PO) Take by mouth.    . valACYclovir (VALTREX) 1000 MG tablet Take 1,000 mg by mouth as needed.      No facility-administered medications prior to visit.     Allergies  Allergen Reactions  . Morphine And Related Nausea And Vomiting    ROS Review of Systems    Objective:    Physical Exam  Constitutional: She is oriented to person, place, and time. She appears well-developed and well-nourished.  HENT:  Head: Normocephalic and atraumatic.  Cardiovascular: Normal rate, regular rhythm and normal heart sounds.  Pulmonary/Chest: Effort normal and breath sounds normal.  Neurological: She is alert and oriented to person, place, and time.  Skin: Skin is warm and dry.  Psychiatric: She has a normal mood and affect. Her behavior is normal.    BP 130/71   Pulse 82   Ht '5\' 8"'  (1.727 m)   Wt 192 lb (87.1 kg)   SpO2 100%   BMI 29.19 kg/m  Wt Readings from Last 3 Encounters:  09/04/19 192 lb (87.1 kg)  08/15/19 194 lb (88 kg)  02/25/19 182 lb (82.6 kg)     Health Maintenance Due  Topic Date Due  . Hepatitis C Screening  12-15-1965  . HIV Screening  02/06/1979  . MAMMOGRAM  06/22/2019    There are no preventive care reminders to display for this patient.  No results found for: TSH No results found for: WBC, HGB, HCT, MCV, PLT No results found for: NA, K, CHLORIDE, CO2, GLUCOSE, BUN, CREATININE, BILITOT, ALKPHOS, AST, ALT, PROT, ALBUMIN, CALCIUM, ANIONGAP, EGFR, GFR No results found for: CHOL No results found for: HDL No results found for: LDLCALC No results found for: TRIG No results found for: CHOLHDL No results found for: HGBA1C    Assessment & Plan:   Problem List Items Addressed This Visit      Cardiovascular and Mediastinum   Migraine headache    He is actually doing really well on the [place].  She has been on it for little over a year now and says that this is the best she is ever done with her migraines.  She follows with Dr. Tomi Likens.        Other   Fibromyalgia - Primary    Continue with  Cymbalta.  Happy with current regimen.  Follow-up in 6 months.       Relevant Orders   COMPLETE METABOLIC PANEL WITH GFR   Acute midline low back pain without sciatica    Recommend PT on you tube with Mikki Santee and Leroy Sea.  Other Visit Diagnoses    Screening mammogram, encounter for       Relevant Orders   MM 3D SCREEN BREAST BILATERAL   Screening, lipid       Relevant Orders   Lipid panel   Medication management       Relevant Orders   COMPLETE METABOLIC PANEL WITH GFR   Lipid panel      No orders of the defined types were placed in this encounter.   Follow-up: Return in about 6 months (around 03/03/2020) for Fibro.      [name], MD

## 2019-09-04 NOTE — Assessment & Plan Note (Signed)
Recommend PT on you tube with Mikki Santee and Leroy Sea.

## 2019-09-04 NOTE — Assessment & Plan Note (Signed)
Continue with  Cymbalta.  Happy with current regimen.  Follow-up in 6 months.

## 2019-09-05 LAB — COMPLETE METABOLIC PANEL WITH GFR
AG Ratio: 1.9 (calc) (ref 1.0–2.5)
ALT: 17 U/L (ref 6–29)
AST: 20 U/L (ref 10–35)
Albumin: 4.6 g/dL (ref 3.6–5.1)
Alkaline phosphatase (APISO): 55 U/L (ref 37–153)
BUN: 9 mg/dL (ref 7–25)
CO2: 28 mmol/L (ref 20–32)
Calcium: 9.6 mg/dL (ref 8.6–10.4)
Chloride: 102 mmol/L (ref 98–110)
Creat: 0.69 mg/dL (ref 0.50–1.05)
GFR, Est African American: 114 mL/min/{1.73_m2} (ref >=60)
GFR, Est Non African American: 98 mL/min/{1.73_m2} (ref >=60)
Globulin: 2.4 g/dL (calc) (ref 1.9–3.7)
Glucose, Bld: 106 mg/dL — ABNORMAL HIGH (ref 65–99)
Potassium: 4.6 mmol/L (ref 3.5–5.3)
Sodium: 137 mmol/L (ref 135–146)
Total Bilirubin: 0.5 mg/dL (ref 0.2–1.2)
Total Protein: 7 g/dL (ref 6.1–8.1)

## 2019-09-05 LAB — LIPID PANEL
Cholesterol: 218 mg/dL — ABNORMAL HIGH (ref <200)
HDL: 67 mg/dL (ref >=50)
LDL Cholesterol (Calc): 134 mg/dL (calc) — ABNORMAL HIGH
Non-HDL Cholesterol (Calc): 151 mg/dL (calc) — ABNORMAL HIGH (ref <130)
Total CHOL/HDL Ratio: 3.3 (calc) (ref <5.0)
Triglycerides: 75 mg/dL (ref <150)

## 2019-09-10 ENCOUNTER — Other Ambulatory Visit: Payer: Self-pay | Admitting: Neurology

## 2019-09-11 NOTE — Telephone Encounter (Signed)
Requested Prescriptions   Pending Prescriptions Disp Refills  . AIMOVIG 70 MG/ML SOAJ [Pharmacy Med Name: AIMOVIG 70 MG/ML AUTOINJECTOR]      Sig: INJECT 70 MG INTO THE SKIN EVERY 30 DAYS.   Rx last filled: 08/11/19 #1 0 refills  Pt last seen:08/15/19  Follow up appt scheduled:08/19/20

## 2019-10-02 ENCOUNTER — Other Ambulatory Visit: Payer: Self-pay

## 2019-10-02 ENCOUNTER — Ambulatory Visit (INDEPENDENT_AMBULATORY_CARE_PROVIDER_SITE_OTHER): Payer: Managed Care, Other (non HMO)

## 2019-10-02 DIAGNOSIS — Z1231 Encounter for screening mammogram for malignant neoplasm of breast: Secondary | ICD-10-CM | POA: Diagnosis not present

## 2019-10-07 ENCOUNTER — Emergency Department (HOSPITAL_COMMUNITY)
Admission: EM | Admit: 2019-10-07 | Discharge: 2019-10-07 | Disposition: A | Discharge status: Home / Self Care | Payer: No Typology Code available for payment source | Attending: Emergency Medicine | Admitting: Emergency Medicine

## 2019-10-07 ENCOUNTER — Emergency Department (HOSPITAL_COMMUNITY): Payer: No Typology Code available for payment source

## 2019-10-07 ENCOUNTER — Encounter (HOSPITAL_COMMUNITY): Payer: Self-pay | Admitting: *Deleted

## 2019-10-07 ENCOUNTER — Other Ambulatory Visit: Payer: Self-pay

## 2019-10-07 DIAGNOSIS — F0781 Postconcussional syndrome: Secondary | ICD-10-CM

## 2019-10-07 DIAGNOSIS — Z86718 Personal history of other venous thrombosis and embolism: Secondary | ICD-10-CM | POA: Diagnosis not present

## 2019-10-07 DIAGNOSIS — Y990 Civilian activity done for income or pay: Secondary | ICD-10-CM | POA: Insufficient documentation

## 2019-10-07 DIAGNOSIS — R519 Headache, unspecified: Secondary | ICD-10-CM | POA: Diagnosis not present

## 2019-10-07 DIAGNOSIS — M542 Cervicalgia: Secondary | ICD-10-CM | POA: Diagnosis not present

## 2019-10-07 DIAGNOSIS — Y939 Activity, unspecified: Secondary | ICD-10-CM | POA: Insufficient documentation

## 2019-10-07 DIAGNOSIS — S0990XA Unspecified injury of head, initial encounter: Secondary | ICD-10-CM | POA: Diagnosis present

## 2019-10-07 DIAGNOSIS — Y929 Unspecified place or not applicable: Secondary | ICD-10-CM | POA: Insufficient documentation

## 2019-10-07 DIAGNOSIS — W208XXA Other cause of strike by thrown, projected or falling object, initial encounter: Secondary | ICD-10-CM | POA: Diagnosis not present

## 2019-10-07 DIAGNOSIS — Z79899 Other long term (current) drug therapy: Secondary | ICD-10-CM | POA: Insufficient documentation

## 2019-10-07 MED ORDER — METHOCARBAMOL 500 MG PO TABS: 500.0000 mg | ORAL_TABLET | Freq: Two times a day (BID) | ORAL | 0 refills | Status: DC

## 2019-10-07 NOTE — ED Provider Notes (Signed)
Denise Ramirez Provider Note   CSN: [id number] Arrival date & time: 10/07/19  1604     History Chief Complaint  Patient presents with  . Head Injury  . Neck Pain    Denise Ramirez is a 53 y.o. female.  HPI  Patient presents for head and neck pain that is constant, severe, worse with movement and touch and associated with radiating, sharp left arm pain and generalized numbness and tingling in her left arm.  Denies any weakness, slurred speech.  Patient describes headache as severe, throbbing, nonradiating, crown of head.   Patient states that she was working when to cast iron baking sheets fell from approximately 4 to 6 feet above her and landed directly on top of her head.  Patient states she is unsure if she lost consciousness but states that bystanders stated that she fell immediately to the ground had no seizure activity.  Patient was able to ambulate after present however she feels tired and achy.  Denies any vision changes, focal weakness, slurred speech, confusion.      Past Medical History:  Diagnosis Date  . Left leg DVT (HCC)    trauma and OCP    Patient Active Problem List   Diagnosis Date Noted  . Acute midline low back pain without sciatica 09/04/2019  . 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 Fibromyalgia 07/11/2018  . Genital herpes simplex 07/11/2018  . RLS (restless legs syndrome) 07/11/2018  . Migraine headache 04/07/2013    Past Surgical History:  Procedure Laterality Date  . BREAST BIOPSY Left   . BREAST EXCISIONAL BIOPSY Left   . CARPAL TUNNEL RELEASE Right   . DILATION AND CURETTAGE OF UTERUS     x 3  . THYROID CYST EXCISION    . TONSILLECTOMY  age 65  . VAGINAL HYSTERECTOMY  02/2018     OB History   No obstetric history on file.     Family History  Adopted: Yes    Social History   Tobacco Use  . Smoking status: Never Smoker  . Smokeless tobacco: Never Used  Substance Use Topics  . Alcohol use: Yes  .  Drug use: Never    Home Medications Prior to Admission medications   Medication Sig Start Date End Date Taking? Authorizing Provider  acetaminophen (TYLENOL) 500 MG tablet Take 500 mg by mouth every 6 (six) hours as needed for moderate pain or headache.    Yes [provider]  AIMOVIG 70 MG/ML SOAJ INJECT 70 MG INTO THE SKIN EVERY 30 DAYS. Patient taking differently: Inject 70 mg as directed every 30 (thirty) days.  09/11/19  Yes Jaffe, Adam R, DO  DULoxetine (CYMBALTA) 20 MG capsule Take 1 capsule (20 mg total) by mouth daily. 02/25/19  Yes Agapito Games, MD  Multiple Vitamin (MULTIVITAMIN) tablet Take 1 tablet by mouth daily.   Yes [provider]  pantoprazole (PROTONIX) 40 MG tablet Take 40 mg by mouth daily.    Yes [provider]  methocarbamol (ROBAXIN) 500 MG tablet Take 1 tablet (500 mg total) by mouth 2 (two) times daily. 10/07/19   Gailen Shelter, PA    Allergies    Morphine and related  Review of Systems   Review of Systems  Constitutional: Negative for fever.  HENT: Negative for congestion.   Respiratory: Negative for shortness of breath.   Cardiovascular: Negative for chest pain.  Gastrointestinal: Negative for abdominal distention.  Musculoskeletal:       Neck pain,  scalp pain  Neurological: Negative for dizziness and headaches.    Physical Exam Updated Vital Signs BP 140/75 (BP Location: Left Arm)   Pulse 72   Temp 98.9 F (37.2 C) (Oral)   Resp 15   SpO2 97%   Physical Exam Vitals and nursing note reviewed.  Constitutional:      General: She is in acute distress.     Appearance: She is not ill-appearing.  HENT:     Head: Normocephalic and atraumatic.     Comments: Exquisite tenderness to palpation over crown of head. Half-dollar sized area of moderate swelling with mild elevation of tissue. No ecchymoses, abrasions, lacerations to the scalp.    Nose: Nose normal.     Mouth/Throat:     Mouth: Mucous membranes are  moist.  Eyes:     General: No scleral icterus. Cardiovascular:     Rate and Rhythm: Normal rate and regular rhythm.     Pulses: Normal pulses.  Pulmonary:     Effort: No respiratory distress.  Abdominal:     Palpations: Abdomen is soft.     Tenderness: There is no abdominal tenderness.  Musculoskeletal:     Cervical back: Normal range of motion.     Right lower leg: No edema.     Left lower leg: No edema.  Skin:    General: Skin is warm and dry.     Capillary Refill: Capillary refill takes less than 2 seconds.  Neurological:     Mental Status: She is alert. Mental status is at baseline.     Comments: Alert and oriented to self, place, time and event.   Speech is fluent, clear without dysarthria or dysphasia.   Strength 5/5 in upper/lower extremities  Sensation intact in upper/lower extremities   Normal gait.  CN I not tested  CN II grossly intact visual fields bilaterally. Did not visualize posterior eye.   CN III, IV, VI PERRLA and EOMs intact bilaterally  CN V Intact sensation to sharp and light touch to the face  CN VII facial movements symmetric  CN VIII not tested  CN IX, X no uvula deviation, symmetric rise of soft palate  CN XI 5/5 SCM and trapezius strength bilaterally  CN XII Midline tongue protrusion, symmetric L/R movements   Psychiatric:        Mood and Affect: Mood normal.        Behavior: Behavior normal.     ED Results / Procedures / Treatments   Labs (all labs ordered are listed, but only abnormal results are displayed) Labs Reviewed - No data to display  EKG None  Radiology CT Head Wo Contrast  Result Date: 10/07/2019 CLINICAL DATA:  Two 40 pound pans fell on the patient's head at 2:15 p.m. today. Headache and neck pain. Numbness in the left arm and tingling in the right arm. Initial encounter. EXAM: CT HEAD WITHOUT CONTRAST CT CERVICAL SPINE WITHOUT CONTRAST TECHNIQUE: Multidetector CT imaging of the head and cervical spine was performed  following the standard protocol without intravenous contrast. Multiplanar CT image reconstructions of the cervical spine were also generated. COMPARISON:  None. FINDINGS: CT HEAD FINDINGS Brain: No evidence of acute infarction, hemorrhage, hydrocephalus, extra-axial collection or mass lesion/mass effect. Vascular: No hyperdense vessel or unexpected calcification. Skull: Intact.  No focal lesion. Sinuses/Orbits: Negative. Other: None. CT CERVICAL SPINE FINDINGS Alignment: Maintained with straightening of lordosis noted. Skull base and vertebrae: No acute fracture. No primary bone lesion or focal pathologic process. Soft tissues and  spinal canal: No prevertebral fluid or swelling. No visible canal hematoma. Disc levels: Mild loss of disc space height and endplate spurring are seen at C5-6. Upper chest: Negative. Other: None. IMPRESSION: Negative head CT. No acute abnormality cervical spine. Mild appearing degenerative disc disease C5-6. Electronically Signed   By: Drusilla Kanner M.D.   On: 10/07/2019 19:09   CT Cervical Spine Wo Contrast  Result Date: 10/07/2019 CLINICAL DATA:  Two 40 pound pans fell on the patient's head at 2:15 p.m. today. Headache and neck pain. Numbness in the left arm and tingling in the right arm. Initial encounter. EXAM: CT HEAD WITHOUT CONTRAST CT CERVICAL SPINE WITHOUT CONTRAST TECHNIQUE: Multidetector CT imaging of the head and cervical spine was performed following the standard protocol without intravenous contrast. Multiplanar CT image reconstructions of the cervical spine were also generated. COMPARISON:  None. FINDINGS: CT HEAD FINDINGS Brain: No evidence of acute infarction, hemorrhage, hydrocephalus, extra-axial collection or mass lesion/mass effect. Vascular: No hyperdense vessel or unexpected calcification. Skull: Intact.  No focal lesion. Sinuses/Orbits: Negative. Other: None. CT CERVICAL SPINE FINDINGS Alignment: Maintained with straightening of lordosis noted. Skull base  and vertebrae: No acute fracture. No primary bone lesion or focal pathologic process. Soft tissues and spinal canal: No prevertebral fluid or swelling. No visible canal hematoma. Disc levels: Mild loss of disc space height and endplate spurring are seen at C5-6. Upper chest: Negative. Other: None. IMPRESSION: Negative head CT. No acute abnormality cervical spine. Mild appearing degenerative disc disease C5-6. Electronically Signed   By: Drusilla Kanner M.D.   On: 10/07/2019 19:09    Procedures Procedures (including critical care time)  Medications Ordered in ED Medications - No data to display  ED Course  I have reviewed the triage vital signs and the nursing notes.  Pertinent labs & imaging results that were available during my care of the patient were reviewed by me and considered in my medical decision making (see chart for details).    MDM Rules/Calculators/A&P                      Patient presents after 40 lb weight fell ~8 feet onto her head possibly knocking her out and leaving her with severe neck and head pain. Patient still appears somewhat dazed at times during my exam but is able to participate fully and how no neurologic deficits. Patient is well appearing and declines pain medication. I doubt intracranial bleed but d/t mechanism of injury will obtain CT head and c-spine. Patient has C-collar in place but with distracting injury and MOA I believe these images are necessary.   CT scans without fracture or intracranial bleed. C-collar removed and reassessed. Pt has midline TTP of C-spine. FROM of neck. Discussed that CT scans may miss ligamentous injury. Return precautions given and recommended FU with PCP within the week. Patient understanding of this.   Patient educated on post concussive syndrome and she has suffered a concussion today. She is aware of recommendations for this and of symptoms to expect.     This patient appears reasonably screened and I doubt any other  medical condition requiring further workup, evaluation, or treatment in the ED at this time prior to discharge.   Patient's vitals are WNL apart from vital sign abnormalities discussed above, patient is in NAD, and able to ambulate in the ED at their baseline. Pain has been managed or a plan has been made for home management and has no complaints prior to discharge.  Patient is comfortable with above plan and is stable for discharge at this time. All questions were answered prior to disposition. Results from the ER workup discussed with the patient face to face and all questions answered to the best of my ability. The patient is safe for discharge with strict return precautions. Patient appears safe for discharge with appropriate follow-up. Conveyed my impression with the patient and they voiced understanding and are agreeable to plan.   An After Visit Summary was printed and given to the patient.  Portions of this note were generated with Scientist, clinical (histocompatibility and immunogenetics)Dragon dictation software. Dictation errors may occur despite best attempts at proofreading.     Final Clinical Impression(s) / ED Diagnoses Final diagnoses:  Neck pain  Injury of head, initial encounter  Post concussion syndrome    Rx / DC Orders ED Discharge Orders         Ordered    methocarbamol (ROBAXIN) 500 MG tablet  2 times daily     10/07/19 1915           Solon AugustaFondaw, Attikus Bartoszek [place], [place] 10/08/19 0013    Derwood KaplanNanavati, Ankit, MD 10/13/19 [phone]

## 2019-10-07 NOTE — ED Triage Notes (Addendum)
Pt states 2 metal pans, weighing ~40 pounds fell ~8 feet on top of her head while she was kneeling down at 215PM today. Pt complains of pain in the top of her head, her neck, and numbness in her left arm and tingling in her right arm.

## 2019-10-07 NOTE — Discharge Instructions (Signed)
Please take Tylenol and ibuprofen as needed for pain.  Please use Robaxin at nighttime for muscle pain.  Robaxin can occasionally cause drowsiness.  Do not mix with alcohol.  You have suffered a concussion today.  This is a severe brain injury and will take time to recover from.  Please limit screen time stay hydrated and follow-up with your primary care doctor in a week.

## 2019-10-09 ENCOUNTER — Telehealth (INDEPENDENT_AMBULATORY_CARE_PROVIDER_SITE_OTHER): Payer: No Typology Code available for payment source | Admitting: Family Medicine

## 2019-10-09 DIAGNOSIS — S060X0A Concussion without loss of consciousness, initial encounter: Secondary | ICD-10-CM

## 2019-10-09 DIAGNOSIS — F0781 Postconcussional syndrome: Secondary | ICD-10-CM

## 2019-10-09 DIAGNOSIS — M5412 Radiculopathy, cervical region: Secondary | ICD-10-CM

## 2019-10-09 DIAGNOSIS — M542 Cervicalgia: Secondary | ICD-10-CM

## 2019-10-09 DIAGNOSIS — R202 Paresthesia of skin: Secondary | ICD-10-CM

## 2019-10-09 MED ORDER — PREDNISONE 20 MG PO TABS: 40.0000 mg | ORAL_TABLET | Freq: Every day | ORAL | 0 refills | Status: DC

## 2019-10-09 NOTE — Assessment & Plan Note (Signed)
Recommend heat/ice. Ok to use muscle relaxers. Refer to PT.
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
## 2019-10-09 NOTE — Assessment & Plan Note (Addendum)
Dicussed new diagnosis. Recommend rest.  Work not provided.  Avoid stress and avoid strenuous activity. Ice her scalp and neck.  Refer to PT for her neck.  F/U  On Monday before returning to work.

## 2019-10-09 NOTE — Assessment & Plan Note (Signed)
Recommend trial of prednisone.  Call if not improving after the weekend.
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
## 2019-10-12 ENCOUNTER — Encounter: Payer: Self-pay | Admitting: Family Medicine

## 2019-10-20 ENCOUNTER — Telehealth: Payer: Self-pay | Admitting: Neurology

## 2019-10-20 NOTE — Telephone Encounter (Signed)
Ok we can see what he thinks would be better?

## 2019-10-20 NOTE — Telephone Encounter (Signed)
She will need to be seen to discuss her limitations please schedule a visit.thanks

## 2019-10-20 NOTE — Telephone Encounter (Signed)
Patient called regarding a concussion that she had 2 weeks ago. She said that a 30 lb object fell on the top of her head from about 6 ft up. She said she hasn't been having Migraines but she has had a lot of headaches now for about 2 weeks. She has felt Tired and Disconnected. She said she will be laughing one minute and then crying (very emotional). She would like to know what her limitations are for work and she has been feeling anxious. Since the concussion her Left arm has been numb and feels like it's asleep. Please Call. Thank you

## 2019-10-20 NOTE — Telephone Encounter (Signed)
Virtual Visit 

## 2019-10-20 NOTE — Telephone Encounter (Signed)
Or she can wait and ask for Dr. Georgie Chard decision vv or OV

## 2019-10-21 ENCOUNTER — Encounter: Payer: Self-pay | Admitting: Family Medicine

## 2019-10-21 ENCOUNTER — Other Ambulatory Visit: Payer: Self-pay

## 2019-10-21 ENCOUNTER — Telehealth: Payer: Self-pay

## 2019-10-21 ENCOUNTER — Telehealth: Payer: Self-pay | Admitting: Neurology

## 2019-10-21 MED ORDER — DULOXETINE HCL 20 MG PO CPEP: 20.0000 mg | ORAL_CAPSULE | Freq: Two times a day (BID) | ORAL | 0 refills | Status: DC

## 2019-10-21 NOTE — Telephone Encounter (Signed)
Already addressed in telephone note.

## 2019-10-21 NOTE — Telephone Encounter (Signed)
Sent new rx for 30 tablets until patient is seen by [facility]

## 2019-10-21 NOTE — Telephone Encounter (Signed)
Or work note but she needs a virtual appt with me this week. I thought she was going to f/u with me on Monday but then I didn't see her on the schedule. Maybe there was some confusion. See if can put on my schedule sometime this week. Thank you

## 2019-10-21 NOTE — Telephone Encounter (Signed)
Patient scheduled and letter written.

## 2019-10-21 NOTE — Telephone Encounter (Signed)
Patient is needing more medication sent to her pharm on file of the [facility] since Dr. Delice Lesch told her to increase dosage. Please correct instruction of medication to pharm. Thanks!

## 2019-10-21 NOTE — Telephone Encounter (Signed)
Denise Ramirez called and states she needs a letter to extend her time off. She still continues to have headaches, fatigue and confusion. She would like a letter until she can see the Neurologist. She doesn't have an appointment with Neurologist. 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

## 2019-10-22 ENCOUNTER — Ambulatory Visit: Payer: Managed Care, Other (non HMO) | Admitting: Physical Therapy

## 2019-10-23 ENCOUNTER — Telehealth (INDEPENDENT_AMBULATORY_CARE_PROVIDER_SITE_OTHER): Payer: No Typology Code available for payment source | Admitting: Family Medicine

## 2019-10-23 ENCOUNTER — Encounter: Payer: Self-pay | Admitting: Family Medicine

## 2019-10-23 DIAGNOSIS — M542 Cervicalgia: Secondary | ICD-10-CM

## 2019-10-23 DIAGNOSIS — M5412 Radiculopathy, cervical region: Secondary | ICD-10-CM | POA: Diagnosis not present

## 2019-10-23 DIAGNOSIS — S060X0A Concussion without loss of consciousness, initial encounter: Secondary | ICD-10-CM

## 2019-10-23 DIAGNOSIS — W208XXA Other cause of strike by thrown, projected or falling object, initial encounter: Secondary | ICD-10-CM | POA: Diagnosis not present

## 2019-10-23 DIAGNOSIS — Y939 Activity, unspecified: Secondary | ICD-10-CM

## 2019-10-23 DIAGNOSIS — F0781 Postconcussional syndrome: Secondary | ICD-10-CM | POA: Diagnosis not present

## 2019-10-23 MED ORDER — AMITRIPTYLINE HCL 25 MG PO TABS: 25.0000 mg | ORAL_TABLET | Freq: Every day | ORAL | 1 refills | Status: DC

## 2019-10-23 NOTE — Progress Notes (Signed)
Virtual Visit via Video Note  I connected with Denise Ramirez on 10/23/19 at 10:30 AM EST by a video enabled telemedicine application and verified that I am speaking with the correct person using two identifiers.   I discussed the limitations of evaluation and management by telemedicine and the availability of in person appointments. The patient expressed understanding and agreed to proceed.  Subjective:    CC: Follow-up concussion/headaches/neck pain  HPI:  Follow-up postconcussive syndrome.  It is been about 2 weeks now since the initial injury while out when a heavy piece of cookware actually fell on her head.  She is now consulted with the physician through [employer] they have been referred her to [facility] [place].  She has had an x-ray of her neck which showed some issues at C5-C6.  They are actually planning on scheduling her for an MRI and possibly steroid injections in her neck.  They have also referred her to physical therapy but she has not had her first appointment yet.  She is mostly been relying on Tylenol, ibuprofen and her muscle relaxer.  She is still having significant neck pain that is radiating up into her head but she says is really the headaches that are the most bothersome and that are really keeping her from doing things.  She just feels exhausted and was hoping by 2 weeks and she would be feeling a lot better.  Hard to focus.  Still having severe headaches that start at the base of her skull and radiate around to the front of her head bilaterally.  Migraine meds not working at all.  She also followed up with her neurologist.  They have increased her Cymbalta to 40mg .  Been on this increased dose for about 2 days.  They have schedule her for an MRI and possible injections for her neck.    Past medical history, Surgical history, Family history not pertinant except as noted below, Social history, Allergies, and medications have been entered into the medical record,  reviewed, and corrections made.   Review of Systems: No fevers, chills, night sweats, weight loss, chest pain, or shortness of breath.   Objective:    General: Speaking clearly in complete sentences without any shortness of breath.  Alert and oriented x3.  Normal judgment. No apparent acute distress.    Impression and Recommendations:   Neck pain/neck injury-planning on getting MRI and starting physical therapy soon.  I do think this could be triggering some of her headaches.  Concussive syndrome-now on 40 mg of Cymbalta.  We discussed options to help control her headaches including adding a low-dose of amitriptyline but did warn about the mixing the 2 medications for symptoms to monitor.  We also discussed possibly gabapentin.  She actually was on amitriptyline years ago for prophylaxis for her migraines and so has taken before and did well with it.  Cervical radiculopathy-she continues to have numbness and tingling in that left hand which is why they are moving forward with an MRI.    I discussed the assessment and treatment plan with the patient. The patient was provided an opportunity to ask questions and all were answered. The patient agreed with the plan and demonstrated an understanding of the instructions.   The patient was advised to call back or seek an in-person evaluation if the symptoms worsen or if the condition fails to improve as anticipated.   [name], MD

## 2019-10-23 NOTE — Progress Notes (Signed)
Pt reports that the headaches will not go away. She also reports that she is having continued neck pain.  The neurologist sent in Cymbalta 20 mg for her to take BID and she already takes 20 mg at night. I asked her if she was taking a total of 60 mg QD? She stated that she wasn't she was only taking 40 mg daily.  She stated that the ortho is planning on doing an MRI, possible steroids and PT.

## 2019-10-29 ENCOUNTER — Other Ambulatory Visit: Payer: Self-pay | Admitting: Family Medicine

## 2019-10-29 DIAGNOSIS — S060X0A Concussion without loss of consciousness, initial encounter: Secondary | ICD-10-CM

## 2019-10-31 ENCOUNTER — Encounter: Payer: Self-pay | Admitting: Neurology

## 2019-11-03 ENCOUNTER — Telehealth: Payer: Self-pay | Admitting: Neurology

## 2019-11-03 NOTE — Telephone Encounter (Signed)
I received a call over the weekend and had prescribed her a prednisone taper.  If she needs something acute, she can come in for a headache cocktail (will need driver), however I would not take prednisone today as it may cause GI upset with the toradol in the cocktail.

## 2019-11-03 NOTE — Telephone Encounter (Signed)
No answer at 852   

## 2019-11-03 NOTE — Telephone Encounter (Signed)
Can you prescribe her anything or VV

## 2019-11-03 NOTE — Telephone Encounter (Signed)
Husband left msg with after hours about wife having severe migraine and needing something for it. She has taken all the prescription meds and they aren't working. Thanks!

## 2019-11-04 ENCOUNTER — Encounter: Payer: Self-pay | Admitting: Neurology

## 2019-11-04 ENCOUNTER — Telehealth (INDEPENDENT_AMBULATORY_CARE_PROVIDER_SITE_OTHER): Payer: Managed Care, Other (non HMO) | Admitting: Neurology

## 2019-11-04 ENCOUNTER — Other Ambulatory Visit: Payer: Self-pay

## 2019-11-04 DIAGNOSIS — F0781 Postconcussional syndrome: Secondary | ICD-10-CM

## 2019-11-04 DIAGNOSIS — M5412 Radiculopathy, cervical region: Secondary | ICD-10-CM | POA: Diagnosis not present

## 2019-11-04 DIAGNOSIS — G43019 Migraine without aura, intractable, without status migrainosus: Secondary | ICD-10-CM | POA: Diagnosis not present

## 2019-11-04 MED ORDER — GABAPENTIN 100 MG PO CAPS: ORAL_CAPSULE | ORAL | 0 refills | Status: DC

## 2019-11-04 NOTE — Patient Instructions (Signed)
1.  Start gabapentin 100mg .  Take 1 capsule at bedtime for a week, then increase to 2 capsules at bedtime 2.  Decrease cymbalta back to once at bedtime 3.  Use Migranal spray for migraine.  If not effective, try Ubrelvy 100mg .  May repeat dose after 2 hours if needed (maximum 2 tablets in 24 hours).  You may also try taking it as first line.  4.  Start turmeric 500mg  twice daily and alpha lipoic acid 100mg  twice daily for anti-inflammatory properties. 5.  Follow up in 3 to 4 months.

## 2019-11-04 NOTE — Progress Notes (Signed)
Virtual Visit via Video Note The purpose of this virtual visit is to provide medical care while limiting exposure to the novel coronavirus.    Consent was obtained for video visit:  Yes.   Answered questions that patient had about telehealth interaction:  Yes.   I discussed the limitations, risks, security and privacy concerns of performing an evaluation and management service by telemedicine. I also discussed with the patient that there may be a patient responsible charge related to this service. The patient expressed understanding and agreed to proceed.  Pt location: Home Physician Location: office Name of referring provider:  [employer], * I connected with Denise Ramirez at patients initiation/request on 11/04/2019 at  3:30 PM EST by video enabled telemedicine application and verified that I am speaking with the correct person using two identifiers. Pt MRN:  [id number] Pt DOB:  1965-11-12 Video Participants:  Denise Ramirez   History of Present Illness:  Denise Ramirez is a 54 year old female who presents for worsening migraines following concussion.  She was injured at work on 10/07/2019, after a 30 lb pack of frying pans landed from 6 feet on top of her head.  No loss of consciousness but developed headache and left sided neck pain with radiating left arm numbness.  On day of accident, CT of cervical spine was performed, which was personally reviewed and revealed spurring and decreased disc space height at C5-6.  CT head without acute intracranial abnormality.  For a couple of weeks, she felt disconnected, had emotional lability and had trouble concentrating.  She was kept out of work.  While cognitive and mood symptoms have improved, she continues to have headache and left sided neck and radicular pain.  Last week, her Cymbalta was increased to 20mg  twice daily.  She had an intractable migraine over the weekend that did not respond to Migranal and she was started on  a prednisone taper which she is currently still completing.  The migraine has aborted but still has residual headache.  She started physical therapy today.  She is being followed by orthopedics.  She had an MRI of the cervical spine today but results are still pending.  Past Medical History: Past Medical History:  Diagnosis Date  . Left leg DVT (HCC)    trauma and OCP    Medications: Outpatient Encounter Medications as of 11/04/2019  Medication Sig Note  . acetaminophen (TYLENOL) 500 MG tablet Take 500 mg by mouth every 6 (six) hours as needed for moderate pain or headache.    01/02/2020 AIMOVIG 70 MG/ML SOAJ INJECT 70 MG INTO THE SKIN EVERY 30 DAYS. (Patient taking differently: Inject 70 mg as directed every 30 (thirty) days. ) 10/07/2019: Always takes on the 20th of the month  . DULoxetine (CYMBALTA) 20 MG capsule Take 1 capsule (20 mg total) by mouth daily.   . DULoxetine (CYMBALTA) 20 MG capsule Take 1 capsule (20 mg total) by mouth 2 (two) times daily.   . methocarbamol (ROBAXIN) 500 MG tablet Take 1 tablet (500 mg total) by mouth 2 (two) times daily.   . Multiple Vitamin (MULTIVITAMIN) tablet Take 1 tablet by mouth daily.   . pantoprazole (PROTONIX) 40 MG tablet Take 40 mg by mouth daily.    . predniSONE (STERAPRED UNI-PAK 21 TAB) 10 MG (21) TBPK tablet    . gabapentin (NEURONTIN) 100 MG capsule Take 1 capsule at bedtime for one week, then increase to 2 capsules at bedtime.   . [DISCONTINUED] amitriptyline (  ELAVIL) 25 MG tablet Take 1 tablet (25 mg total) by mouth at bedtime. (Patient not taking: Reported on 11/04/2019)    No facility-administered encounter medications on file as of 11/04/2019.    Allergies: Allergies  Allergen Reactions  . Morphine And Related Nausea And Vomiting and Swelling    Family History: Family History  Adopted: Yes    Social History: Social History   Socioeconomic History  . Marital status: Married    Spouse name: Matt  . Number of children: 3  . Years  of education: Not on file  . Highest education level: Associate degree: academic program  Occupational History  . Occupation: Occupational hygienist  Tobacco Use  . Smoking status: Never Smoker  . Smokeless tobacco: Never Used  Substance and Sexual Activity  . Alcohol use: Yes  . Drug use: Never  . Sexual activity: Yes    Partners: Male  Other Topics Concern  . Not on file  Social History Narrative   Patient was born left-handed. She was trained in parochial school to use her right hand. She uses her left hand to eat. She lives with her husband, they are closing on a 2 story house, staying with relatives for the time being. She drinks  2 cups of coffee a day, and rare soda. She participates in weight training twice a week.   Social Determinants of Health   Financial Resource Strain:   . Difficulty of Paying Living Expenses: Not on file  Food Insecurity:   . Worried About Programme researcher, broadcasting/film/video in the Last Year: Not on file  . Ran Out of Food in the Last Year: Not on file  Transportation Needs:   . Lack of Transportation (Medical): Not on file  . Lack of Transportation (Non-Medical): Not on file  Physical Activity:   . Days of Exercise per Week: Not on file  . Minutes of Exercise per Session: Not on file  Stress:   . Feeling of Stress : Not on file  Social Connections:   . Frequency of Communication with Friends and Family: Not on file  . Frequency of Social Gatherings with Friends and Family: Not on file  . Attends Religious Services: Not on file  . Active Member of Clubs or Organizations: Not on file  . Attends Banker Meetings: Not on file  . Marital Status: Not on file  Intimate Partner Violence:   . Fear of Current or Ex-Partner: Not on file  . Emotionally Abused: Not on file  . Physically Abused: Not on file  . Sexually Abused: Not on file    Observations/Objective:   There were no vitals taken for this visit. No acute distress.  Alert and oriented.  Speech  fluent and not dysarthric.  Language intact.  Eyes orthophoric on primary gaze.  Face symmetric.  Assessment and Plan:   1.  Migraine without aura, without status migrainosus, intractable 2.  Left cervical radiculopathy 3.  Postconcussion syndrome, improved  1.  Aimovig 70mg  for migraine preventative therapy 2.  To address headache, neck and radicular pain, start gabapentin 100mg  at bedtime for one week, then increase to 200mg  at bedtime 3.  Decrease Cymbalta back to once a day. 4.  Migranal for migraine abortive therapy.  Will have her try Ubrelvy 100mg  for rescue therapy (either first line or if Migranal should fail again). 5.  Agree with physical therapy. 6.  Recommend turmeric 500mg  twice daily and alpha lipoic acid 100mg  twice daily for anti-inflammatory properties 7.  Follow up in 3 to 4 months.  Follow Up Instructions:    -I discussed the assessment and treatment plan with the patient. The patient was provided an opportunity to ask questions and all were answered. The patient agreed with the plan and demonstrated an understanding of the instructions.   The patient was advised to call back or seek an in-person evaluation if the symptoms worsen or if the condition fails to improve as anticipated.   Dudley Major, DO

## 2019-11-05 ENCOUNTER — Other Ambulatory Visit: Payer: Self-pay | Admitting: Neurology

## 2020-01-26 ENCOUNTER — Encounter: Payer: Self-pay | Admitting: Family Medicine

## 2020-01-27 ENCOUNTER — Encounter: Payer: Self-pay | Admitting: Family Medicine

## 2020-01-27 ENCOUNTER — Ambulatory Visit (INDEPENDENT_AMBULATORY_CARE_PROVIDER_SITE_OTHER): Payer: Managed Care, Other (non HMO)

## 2020-01-27 ENCOUNTER — Other Ambulatory Visit: Payer: Self-pay

## 2020-01-27 ENCOUNTER — Ambulatory Visit (INDEPENDENT_AMBULATORY_CARE_PROVIDER_SITE_OTHER): Payer: Managed Care, Other (non HMO) | Admitting: Family Medicine

## 2020-01-27 VITALS — BP 132/50 | HR 89 | Ht 68.0 in | Wt 201.0 lb

## 2020-01-27 DIAGNOSIS — M545 Low back pain: Secondary | ICD-10-CM | POA: Diagnosis not present

## 2020-01-27 DIAGNOSIS — M25561 Pain in right knee: Secondary | ICD-10-CM

## 2020-01-27 DIAGNOSIS — G8929 Other chronic pain: Secondary | ICD-10-CM

## 2020-01-27 DIAGNOSIS — R202 Paresthesia of skin: Secondary | ICD-10-CM

## 2020-01-27 NOTE — Progress Notes (Signed)
Established Patient Office Visit  Subjective:  Patient ID: Denise Ramirez, female    DOB: 1966-01-11  Age: 54 y.o. MRN: [id number]  CC:  Chief Complaint  Patient presents with  . Leg Pain    HPI Denise Ramirez presents for right leg pain.  54 year old female has been recovering from a concussion that occurred in December after something fell at work and hit her on the head.  She did do a consult with neurology in January because she was having some persistent symptoms.    More recently starting yesterday while trying to get in the car, she got sudden pain in her right thigh down to her knee.  She said she was able to hobble into the house.  But then had trouble actually straightening her leg.She was unable to bend or straighten it all night.  Prior hx of DVT in her other leg after a quadriceps injury years ago.  Probably MI 17 years ago.. Tried Tylenol.    Said she had actually plan on making an appointment to go over some concerns with her left leg.  This is the leg that was injury years ago with a quadriceps injury and had the DVT.  Also about 3 years ago she had 100 pound mirror landed on the back of her leg just below her calf.  She reports the entire left leg gets numb and tingly and can wake her up 3 x a night.  He can only lay on her left side for a few minutes before she just has to get off of it because of pain and discomfort.  She says she has a chronic pain into her left calf.  More recently she was tried on gabapentin 300 mg twice a day for her neck pain and she thought that it might also help with her leg pain but says it did not.  In fact she is interested in going ahead and weaning off of the gabapentin she really feels like it just has not been helpful.  She also reports some occasional incontinence episodes.  She says on and off over the the last few years after her hysterectomy she has had some occasional incontinence after coughing or sneezing but last week she said  she had just gone to the bathroom and emptied her bladder and then when she stood up to walk out of the restroom she had some significant leakage.  Past Medical History:  Diagnosis Date  . 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 BREAST BIOPSY Left   . BREAST EXCISIONAL BIOPSY Left   . CARPAL TUNNEL RELEASE Right   . DILATION AND CURETTAGE OF UTERUS     x 3  . THYROID CYST EXCISION    . TONSILLECTOMY  age 37  . VAGINAL HYSTERECTOMY  02/2018    Family History  Adopted: Yes    Social History   Socioeconomic History  . Marital status: Married    Spouse name: Matt  . Number of children: 3  . Years of education: Not on file  . Highest education level: Associate degree: academic program  Occupational History  . Occupation: Occupational hygienist  Tobacco Use  . Smoking status: Never Smoker  . Smokeless tobacco: Never Used  Substance and Sexual Activity  . Alcohol use: Yes  . Drug use: Never  . Sexual activity: Yes    Partners: Male  Other Topics Concern  .  Not on file  Social History Narrative   Patient was born left-handed. She was trained in parochial school to use her right hand. She uses her left hand to eat. She lives with her husband, they are closing on a 2 story house, staying with relatives for the time being. She drinks  2 cups of coffee a day, and rare soda. She participates in weight training twice a week.   Social Determinants of Health   Financial Resource Strain:   . Difficulty of Paying Living Expenses:   Food Insecurity:   . Worried About Charity fundraiser in the Last Year:   . Arboriculturist in the Last Year:   Transportation Needs:   . Film/video editor (Medical):   Marland Kitchen Lack of Transportation (Non-Medical):   Physical Activity:   . Days of Exercise per Week:   . Minutes of Exercise per Session:   Stress:   . Feeling of Stress :   Social Connections:   . Frequency of Communication with Friends  and Family:   . Frequency of Social Gatherings with Friends and Family:   . Attends Religious Services:   . Active Member of Clubs or Organizations:   . Attends Archivist Meetings:   Marland Kitchen Marital Status:   Intimate Partner Violence:   . Fear of Current or Ex-Partner:   . Emotionally Abused:   Marland Kitchen Physically Abused:   . Sexually Abused:     Outpatient Medications Prior to Visit  Medication Sig Dispense Refill  . acetaminophen (TYLENOL) 500 MG tablet Take 500 mg by mouth every 6 (six) hours as needed for moderate pain or headache.     Marland Kitchen AIMOVIG 70 MG/ML SOAJ INJECT 70 MG INTO THE SKIN EVERY 30 DAYS. (Patient taking differently: Inject 70 mg as directed every 30 (thirty) days. ) 1 pen 11  . DULoxetine (CYMBALTA) 20 MG capsule Take 1 capsule (20 mg total) by mouth daily. 90 capsule 2  . gabapentin (NEURONTIN) 300 MG capsule Take 300 mg by mouth every evening.    . methocarbamol (ROBAXIN) 500 MG tablet Take 1 tablet (500 mg total) by mouth 2 (two) times daily. 20 tablet 0  . Multiple Vitamin (MULTIVITAMIN) tablet Take 1 tablet by mouth daily.    . pantoprazole (PROTONIX) 40 MG tablet Take 40 mg by mouth daily.     . DULoxetine (CYMBALTA) 20 MG capsule TAKE 1 CAPSULE BY MOUTH TWICE A DAY 180 capsule 1  . gabapentin (NEURONTIN) 100 MG capsule Take 1 capsule at bedtime for one week, then increase to 2 capsules at bedtime. 60 capsule 0  . predniSONE (STERAPRED UNI-PAK 21 TAB) 10 MG (21) TBPK tablet      No facility-administered medications prior to visit.    Allergies  Allergen Reactions  . Morphine And Related Nausea And Vomiting and Swelling    ROS Review of Systems    Objective:    Physical Exam  Constitutional: She is oriented to person, place, and time. She appears well-developed and well-nourished.  HENT:  Head: Normocephalic and atraumatic.  Eyes: Conjunctivae and EOM are normal.  Cardiovascular: Normal rate.  Pulmonary/Chest: Effort normal.  Musculoskeletal:      Comments: Mild tenderness over the lumbar spine and SI joints.  Negative straight leg raise.  Hip, knee, ankle strength is 5-5 bilaterally.  Right knee with normal range of motion.  No significant crepitus.  Negative McMurray's.  Normal anterior drawer.  No pain or increased laxity with valgus or  varus stress.  Neurological: She is alert and oriented to person, place, and time.  Telemetry reflexes 1+ bilaterally  Skin: Skin is dry. No pallor.  Psychiatric: She has a normal mood and affect. Her behavior is normal.  Vitals reviewed.   BP (!) 132/50   Pulse 89   Ht 5\' 8"  (1.727 m)   Wt 201 lb (91.2 kg)   SpO2 100%   BMI 30.56 kg/m  Wt Readings from Last 3 Encounters:  01/27/20 201 lb (91.2 kg)  09/04/19 192 lb (87.1 kg)  08/15/19 194 lb (88 kg)     Health Maintenance Due  Topic Date Due  . Hepatitis C Screening  Never done  . HIV Screening  Never done    There are no preventive care reminders to display for this patient.  No results found for: TSH No results found for: WBC, HGB, HCT, MCV, PLT Lab Results  Component Value Date   NA 137 09/04/2019   K 4.6 09/04/2019   CO2 28 09/04/2019   GLUCOSE 106 (H) 09/04/2019   BUN 9 09/04/2019   CREATININE 0.69 09/04/2019   BILITOT 0.5 09/04/2019   AST 20 09/04/2019   ALT 17 09/04/2019   PROT 7.0 09/04/2019   CALCIUM 9.6 09/04/2019   Lab Results  Component Value Date   CHOL 218 (H) 09/04/2019   Lab Results  Component Value Date   HDL 67 09/04/2019   Lab Results  Component Value Date   LDLCALC 134 (H) 09/04/2019   Lab Results  Component Value Date   TRIG 75 09/04/2019   Lab Results  Component Value Date   CHOLHDL 3.3 09/04/2019   No results found for: HGBA1C    Assessment & Plan:   Problem List Items Addressed This Visit    None    Visit Diagnoses    Chronic midline low back pain without sciatica    -  Primary   Relevant Orders   DG Lumbar Spine Complete   Leg paresthesia       Relevant Orders   DG  Lumbar Spine Complete   Acute pain of right knee          Left leg paresthesias and pain-she feels like it is over the entire leg its not just in a nerve distribution.  Discussed further work-up starting with an x-ray of the lumbar spine and most likely MRI.  I am also concerned about the intermittent incontinence episodes as well.  Incontinence-occurring after she empties her bladder.  Concerning for possible spinal cord compression.  After x-ray we will move forward with MRI of the lumbar spine.  Right knee pain-does feel better today.  She also has some tightness in the right calf.  Discussed possibly getting ultrasound to evaluate for DVT if she is not improving over the next 24 hours.  She does feel better today compared to yesterday.  No orders of the defined types were placed in this encounter.   Follow-up: No follow-ups on file.    13/09/2019, MD

## 2020-01-28 ENCOUNTER — Encounter: Payer: Self-pay | Admitting: Family Medicine

## 2020-01-28 DIAGNOSIS — M5136 Other intervertebral disc degeneration, lumbar region: Secondary | ICD-10-CM

## 2020-01-28 NOTE — Telephone Encounter (Signed)
Will you please review x-ray results for patient and let her know next steps?

## 2020-01-30 ENCOUNTER — Encounter: Payer: Self-pay | Admitting: Physician Assistant

## 2020-01-30 DIAGNOSIS — M5136 Other intervertebral disc degeneration, lumbar region: Secondary | ICD-10-CM | POA: Insufficient documentation

## 2020-01-30 NOTE — Progress Notes (Signed)
Sent through [place]. Lots of arthritis and little bone spurs. Ordered MRI to further look at spinal cord and disc.

## 2020-02-12 ENCOUNTER — Encounter: Payer: Self-pay | Admitting: Family Medicine

## 2020-02-12 DIAGNOSIS — M797 Fibromyalgia: Secondary | ICD-10-CM

## 2020-02-12 NOTE — Telephone Encounter (Signed)
Routing to provider  

## 2020-02-13 MED ORDER — DULOXETINE HCL 60 MG PO CPEP: 60.0000 mg | ORAL_CAPSULE | Freq: Every day | ORAL | 0 refills | Status: DC

## 2020-02-21 ENCOUNTER — Ambulatory Visit (INDEPENDENT_AMBULATORY_CARE_PROVIDER_SITE_OTHER): Payer: Managed Care, Other (non HMO)

## 2020-02-21 ENCOUNTER — Other Ambulatory Visit: Payer: Self-pay

## 2020-02-21 DIAGNOSIS — M5136 Other intervertebral disc degeneration, lumbar region: Secondary | ICD-10-CM

## 2020-02-23 ENCOUNTER — Encounter: Payer: Self-pay | Admitting: Family Medicine

## 2020-02-26 NOTE — Progress Notes (Signed)
NEUROLOGY FOLLOW UP OFFICE NOTE  Denise Ramirez [id number]  HISTORY OF PRESENT ILLNESS: Denise Ramirez is a 54 year old female who presents for worsening migraines following concussion.  UPDATE:  She had an MRI of the cervical spine on 11/04/2019 which demonstrated " 1.  Multilevel degenerative changes resulting in predominantly foraminal stenosis most pronounced at C5-6, left greater than right.  2.  Degenerative marrow edema surrounding the left C2-C3 and C3-C4 facet joints, as well as the left C7-T1 facet joint.  No substantial canal stenosis or spinal cord signal abnormality."  To address cervical radicular pain, she was started on gabapentin which was ineffective.  She is now on Cymbalta (prescribed by orthopedics/pain management).  She was seen by neurology at [facility] (as per workman's comp) for NCV-EMG of left upper extremity.  Results unknown.  She is being treated by orthopedics/pain management for cervical radicular pain.  She had physical therapy which was ineffective.  She had two epidurals (C5-6) which was ineffective.  They are now discussing fusion.    Headaches have been worse, particularly occipital and aggravated by neck pain.  She has 20 headache days a month, 3 are migraine.  Migranal not always helpful.  Bernita Raisin helps make it manageable but still lasts 2 days with 1 day of hangover.  For neck pain, she takes ibuprofen everry other day.  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  She has had some residual leg pain since then.  About 3 weeks ago, her left leg started to become completely numb with worsening pain down the lateral leg.  No weakness but she noted rare urinary incontinence but now bowel dysfunction.  No saddle anesthesia.  MRI L-spine 02/22/2020 personally reviewed showed normal conus and cauda equina, mild multilevel degenerative changes with moderate to advance disc degegneration with mild bilateral foraminal narrowing but no neural  compression.  At the same time, she started noticing numbness and tingling in the right hand and right foot.    Intensity:  severe Duration:  1/2 to 1 day Frequency:  3 days a month Rescue protocol:  2 ibuprofen with coffee; after 60 to 90 minutes will take Migranal (usually breaks immediately).  Will take ibuprofen an hour later (rarely) Frequency of abortive medication: 3 days a week Current NSAIDS:none Current analgesics:Tylenol Current triptans:Relpax 40mg  Current ergotamine:Migranal Current anti-emetic:no Current muscle relaxants:Flexeril Current anti-anxiolytic:no Current sleep aide:no Current Antihypertensive medications:no Current Antidepressant medications:Cymbalta 20mg  daily Current Anticonvulsant medications:Lamotrigine 50mg  twice daily; gabapentin 200mg  at bedtime Current anti-CGRP: Aimovig 70 mg Current Vitamins/Herbal/Supplements:MVI; turmeric 500mg ; alpha lipoic acid 100mg  twice daily Current Antihistamines/Decongestants:no Other therapy:Daith piercing Hormone/birth control:no  Left leg pain DVT trauma 18 years ago.   now incontinence numb   Right foot numbness and tingling right foot.  Tingling   Caffeine:2 cups of coffee in AM Alcohol:Occasional wine Smoker:no Diet:At least 8 16.5 oz bottles water daily, Caffeine-free Coke Exercise:2 days a week Depression:no; Anxiety:no Other pain:Fibromyalgia, neck pain Sleep hygiene:Good. She used to snore and was told she may have had sleep apnea but she lost 50 lbs before testing. 3 pm she starts feeling sleepy.  HISTORY: Onset: Childhood/toddler Location:holocephalic Quality:Normally starts pounding then tender Initial intensity:Severe.Shedenies new headache, thunderclap headache or severe headache that wakes herfrom sleep. Aura:Squiggly lines with peripheral vision loss with pinpoint scotoma, lasts up to 30 minutes followed by  headache Prodrome:no Postdrome:Hangover effect for 2 days Associated symptoms: Nausea, vomiting, photophobia, phonophobia, nose runs, paresthesias in face and arms.Shedenies associated ,  unilateral weakness. Initial duration:Wakes up with them and resolves in 2-3 hours and returns in afternoon. Sometimes intractable up to 10 days Initial Frequency:15 migraine days a month, daily headaches She has intractable migraines Frequency of abortive medication:ibuprofen daily Triggers: Bright sunlight, sleep deprivation, formerly menses Relieving factors: Red wine, sometimes eating sweets, eye pillow, cold wash cloth, needs to lay down to the left. Activity:Cannot function She has prior history of menstrual migraines and exertional headaches.  She was injured at work on 10/07/2019, after a 30 lb pack of frying pans landed from 6 feet on top of her head.  No loss of consciousness but developed headache and left sided neck pain with radiating left arm numbness.  On day of accident, CT of cervical spine was performed, which was personally reviewed and revealed spurring and decreased disc space height at C5-6.  CT head without acute intracranial abnormality.  For a couple of weeks, she felt disconnected, had emotional lability and had trouble concentrating.  She was kept out of work.  While cognitive and mood symptoms have improved, she continues to have headache and left sided neck and radicular pain.  Last week, her Cymbalta was increased to 20mg  twice daily.  She had an intractable migraine over the weekend that did not respond to Ramah and she was started on a prednisone taper which she is currently still completing.  The migraine has aborted but still has residual headache.  She started physical therapy.   Past NSAIDS:naproxen, ibuprofen, Cambia. Insurance denied Sprix NS Past analgesics:Fioricet, Excedrin, Excedrin Migraine, tramadol Past abortive triptans:Sumatriptan tablet,  sumatriptan [place], Zomig NS, Maxalt Past abortive ergotamine:none Past muscle relaxants:Flexeril Past anti-emetic:Promethazine Past antihypertensive medications:propranolol Past antidepressant medications:Amitriptyline, nortriptyline Past anticonvulsant medications:Topiramate, Depakote, Keppra Past anti-CGRP:no Past vitamins/Herbal/Supplements:Magnesium 500mg , riboflavin 100mg  Past antihistamines/decongestants:no Other past therapies:Botox (1 round, then she moved out of area), prednisone tapers for intractable headache, coffee   Family history of headache:Adopted.  PAST MEDICAL HISTORY: Past Medical History:  Diagnosis Date  . Left leg DVT (HCC)    trauma and OCP    MEDICATIONS: Current Outpatient Medications on File Prior to Visit  Medication Sig Dispense Refill  . acetaminophen (TYLENOL) 500 MG tablet Take 500 mg by mouth every 6 (six) hours as needed for moderate pain or headache.     Marland Kitchen AIMOVIG 70 MG/ML SOAJ INJECT 70 MG INTO THE SKIN EVERY 30 DAYS. (Patient taking differently: Inject 70 mg as directed every 30 (thirty) days. ) 1 pen 11  . DULoxetine (CYMBALTA) 60 MG capsule Take 1 capsule (60 mg total) by mouth daily. 1 capsule 0  . gabapentin (NEURONTIN) 300 MG capsule Take 300 mg by mouth every evening.    . methocarbamol (ROBAXIN) 500 MG tablet Take 1 tablet (500 mg total) by mouth 2 (two) times daily. 20 tablet 0  . Multiple Vitamin (MULTIVITAMIN) tablet Take 1 tablet by mouth daily.    . pantoprazole (PROTONIX) 40 MG tablet Take 40 mg by mouth daily.      No current facility-administered medications on file prior to visit.    ALLERGIES: Allergies  Allergen Reactions  . Morphine And Related Nausea And Vomiting and Swelling    FAMILY HISTORY: Family History  Adopted: Yes  .  SOCIAL HISTORY: Social History   Socioeconomic History  . Marital status: Married    Spouse name: Matt  . Number of children: 3  . Years of education: Not on file   . Highest education level: Associate degree: academic program  Occupational History  .  Occupation: Occupational hygienist  Tobacco Use  . Smoking status: Never Smoker  . Smokeless tobacco: Never Used  Substance and Sexual Activity  . Alcohol use: Yes  . Drug use: Never  . Sexual activity: Yes    Partners: Male  Other Topics Concern  . Not on file  Social History Narrative   Patient was born left-handed. She was trained in parochial school to use her right hand. She uses her left hand to eat. She lives with her husband, they are closing on a 2 story house, staying with relatives for the time being. She drinks  2 cups of coffee a day, and rare soda. She participates in weight training twice a week.   Social Determinants of Health   Financial Resource Strain:   . Difficulty of Paying Living Expenses:   Food Insecurity:   . Worried About Programme researcher, broadcasting/film/video in the Last Year:   . Barista in the Last Year:   Transportation Needs:   . Freight forwarder (Medical):   Marland Kitchen Lack of Transportation (Non-Medical):   Physical Activity:   . Days of Exercise per Week:   . Minutes of Exercise per Session:   Stress:   . Feeling of Stress :   Social Connections:   . Frequency of Communication with Friends and Family:   . Frequency of Social Gatherings with Friends and Family:   . Attends Religious Services:   . Active Member of Clubs or Organizations:   . Attends Banker Meetings:   Marland Kitchen Marital Status:   Intimate Partner Violence:   . Fear of Current or Ex-Partner:   . Emotionally Abused:   Marland Kitchen Physically Abused:   . Sexually Abused:     PHYSICAL EXAM: Blood pressure 135/88, pulse 77, resp. rate 18, height 5\' 8"  (1.727 m), weight 201 lb (91.2 kg), SpO2 98 %. General: No acute distress.  Patient appears well-groomed.   Head:  Normocephalic/atraumatic Eyes:  Fundi examined but not visualized Neck: supple, no paraspinal tenderness, full range of motion Heart:  Regular rate  and rhythm Lungs:  Clear to auscultation bilaterally Back: No paraspinal tenderness Neurological Exam: alert and oriented to person, place, and time. Attention span and concentration intact, recent and remote memory intact, fund of knowledge intact.  Speech fluent and not dysarthric, language intact.  CN II-XII intact. Bulk and tone normal, muscle strength 5/5 throughout.  Sensation to light touch, temperature and vibration intact.  Deep tendon reflexes 2+ throughout, toes downgoing.  Finger to nose and heel to shin testing intact.  Gait normal, Romberg negative.  IMPRESSION: 1.  Migraine without aura, without status migrainosus, not intractable.  Worse, likely related to cervicalgia and degenerative changes in upper cervical spine. 2.  Cervical spondylosis with left radiculopathy 3.  Worsening left leg pain and numbness as well as right foot and hand numbness.  Unclear etiology.  No structural etiology on lumbar MRI to explain symptoms.  No structural abnormality on cervical MRI to explain right hand numbness either.  Unclear if the right hand numbness may be carpal tunnel syndrome.    PLAN: 1. For better migraine control, will increase Aimovig to 140mg  every 28 days 2. For rescue therapy, will have her try Nurtec in addition to Migranal if needed. 3. Will obtain NCV-EMG from [facility] as well as notes from orthopedics. 4. Will order NCV-EMG of right upper and bilateral lower extremities 5. Check B12 and TSH 6. Further recommendations pending results.  Otherwise, follow  up in 4 months.   Shon Millet, DO  CC: Nani Gasser, MD

## 2020-02-27 ENCOUNTER — Other Ambulatory Visit: Payer: Managed Care, Other (non HMO)

## 2020-02-27 ENCOUNTER — Other Ambulatory Visit: Payer: Self-pay

## 2020-02-27 ENCOUNTER — Encounter: Payer: Self-pay | Admitting: Neurology

## 2020-02-27 ENCOUNTER — Ambulatory Visit: Payer: Managed Care, Other (non HMO) | Admitting: Neurology

## 2020-02-27 VITALS — BP 135/88 | HR 77 | Resp 18 | Ht 68.0 in | Wt 201.0 lb

## 2020-02-27 DIAGNOSIS — M5412 Radiculopathy, cervical region: Secondary | ICD-10-CM

## 2020-02-27 DIAGNOSIS — R202 Paresthesia of skin: Secondary | ICD-10-CM | POA: Diagnosis not present

## 2020-02-27 DIAGNOSIS — G43009 Migraine without aura, not intractable, without status migrainosus: Secondary | ICD-10-CM | POA: Diagnosis not present

## 2020-02-27 DIAGNOSIS — R200 Anesthesia of skin: Secondary | ICD-10-CM

## 2020-02-27 LAB — VITAMIN B12: Vitamin B-12: 257 pg/mL (ref 211–911)

## 2020-02-27 LAB — TSH: TSH: 1.44 u[IU]/mL (ref 0.35–4.50)

## 2020-02-27 MED ORDER — AIMOVIG 140 MG/ML ~~LOC~~ SOAJ: 140.0000 mg | SUBCUTANEOUS | 11 refills | Status: DC

## 2020-02-27 NOTE — Patient Instructions (Signed)
1. We will increase Aimovig to 140mg  every 28 days 2.  As second rescue, try Nurtec (once in 24 hours).  If effective, contact me for prescription 3.  Check B12 and TSH 4.  Check nerve study of right arm and bilateral legs 5.  Will obtain notes from prior nerve study 6.  Follow up in 4 months.

## 2020-03-01 ENCOUNTER — Encounter: Payer: Self-pay | Admitting: Family Medicine

## 2020-03-01 ENCOUNTER — Ambulatory Visit (INDEPENDENT_AMBULATORY_CARE_PROVIDER_SITE_OTHER): Payer: Managed Care, Other (non HMO) | Admitting: Family Medicine

## 2020-03-01 VITALS — BP 129/60 | HR 74 | Ht 68.0 in | Wt 198.0 lb

## 2020-03-01 DIAGNOSIS — G43809 Other migraine, not intractable, without status migrainosus: Secondary | ICD-10-CM

## 2020-03-01 DIAGNOSIS — M542 Cervicalgia: Secondary | ICD-10-CM | POA: Diagnosis not present

## 2020-03-01 DIAGNOSIS — R202 Paresthesia of skin: Secondary | ICD-10-CM

## 2020-03-01 DIAGNOSIS — M797 Fibromyalgia: Secondary | ICD-10-CM

## 2020-03-01 DIAGNOSIS — Z1159 Encounter for screening for other viral diseases: Secondary | ICD-10-CM | POA: Diagnosis not present

## 2020-03-01 NOTE — Assessment & Plan Note (Signed)
Followed by Dr. Everlena Cooper.  Unfortunately having more headaches since her injury.  Recent increase on her Aimovig.  Hopefully this will be very helpful for her.

## 2020-03-01 NOTE — Progress Notes (Signed)
Established Patient Office Visit  Subjective:  Patient ID: Denise Ramirez, female    DOB: September 10, 1966  Age: 54 y.o. MRN: [id number]  CC:  Chief Complaint  Patient presents with  . Fibromyalgia    HPI SLOKA VOLANTE presents for follow-up fibromyalgia-overall she is doing okay she is actually just on 40 mg of Cymbalta she is taking 2 of the 20s because her pharmacy had excellently refilled the 20s again instead of the 60s.  She does feel like she is doing okay with that.  She does feel like she has had fewer bowel movements since being on the Cymbalta instead of going daily she is been going every other day.  She does follow with Dr. Everlena Cooper for her migraine headaches but she is also been having more frequent headaches since her concussion.  He is increasing her Aimovig dose he did check her B12 and TSH which were normal.  She is actually getting set up for some nerve conduction studies because of the paresthesias and tingling.  She did want to let me know that since her injury about 6 months ago where a pot/pan display fell on her she still continues to have numbness and tingling down her arm.  She had several cervical spine injections and just has not really made a lot of progress so [facility] a cervical fusion at C5-C6.  She continues to have daily headaches and numbness.  Past Medical History:  Diagnosis Date  . 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 BREAST BIOPSY Left   . BREAST EXCISIONAL BIOPSY Left   . CARPAL TUNNEL RELEASE Right   . DILATION AND CURETTAGE OF UTERUS     x 3  . THYROID CYST EXCISION    . TONSILLECTOMY  age 57  . VAGINAL HYSTERECTOMY  02/2018    Family History  Adopted: Yes    Social History   Socioeconomic History  . Marital status: Married    Spouse name: Matt  . Number of children: 3  . Years of education: Not on file  . Highest education level:  Associate degree: academic program  Occupational History  . Occupation: Occupational hygienist  Tobacco Use  . Smoking status: Never Smoker  . Smokeless tobacco: Never Used  Substance and Sexual Activity  . Alcohol use: Yes  . Drug use: Never  . Sexual activity: Yes    Partners: Male  Other Topics Concern  . Not on file  Social History Narrative   Patient was born left-handed. She was trained in parochial school to use her right hand. She uses her left hand to eat. She lives with her husband, they are closing on a 2 story house, staying with relatives for the time being. She drinks  2 cups of coffee a day, and rare soda. She participates in weight training twice a week.   Social Determinants of Health   Financial Resource Strain:   . Difficulty of Paying Living Expenses:   Food Insecurity:   . Worried About Programme researcher, broadcasting/film/video in the Last Year:   . Barista in the Last Year:   Transportation Needs:   . Freight forwarder (Medical):   Marland Kitchen Lack of Transportation (Non-Medical):   Physical Activity:   . Days of Exercise per Week:   . Minutes of Exercise per Session:   Stress:   .  Feeling of Stress :   Social Connections:   . Frequency of Communication with Friends and Family:   . Frequency of Social Gatherings with Friends and Family:   . Attends Religious Services:   . Active Member of Clubs or Organizations:   . Attends Archivist Meetings:   Marland Kitchen Marital Status:   Intimate Partner Violence:   . Fear of Current or Ex-Partner:   . Emotionally Abused:   Marland Kitchen Physically Abused:   . Sexually Abused:     Outpatient Medications Prior to Visit  Medication Sig Dispense Refill  . acetaminophen (TYLENOL) 500 MG tablet Take 500 mg by mouth every 6 (six) hours as needed for moderate pain or headache.     . DULoxetine (CYMBALTA) 60 MG capsule Take 1 capsule (60 mg total) by mouth daily. (Patient taking differently: Take 40 mg by mouth daily. ) 1 capsule 0  . Erenumab-aooe  (AIMOVIG) 140 MG/ML SOAJ Inject 140 mg into the skin every 28 (twenty-eight) days. 1 pen 11  . Multiple Vitamin (MULTIVITAMIN) tablet Take 1 tablet by mouth daily.    . pantoprazole (PROTONIX) 40 MG tablet Take 40 mg by mouth daily.     Marland Kitchen gabapentin (NEURONTIN) 300 MG capsule Take 300 mg by mouth every evening.    . methocarbamol (ROBAXIN) 500 MG tablet Take 1 tablet (500 mg total) by mouth 2 (two) times daily. (Patient not taking: Reported on 02/27/2020) 20 tablet 0   No facility-administered medications prior to visit.    Allergies  Allergen Reactions  . Morphine And Related Nausea And Vomiting and Swelling    ROS Review of Systems    Objective:    Physical Exam  BP 129/60   Pulse 74   Ht 5\' 8"  (1.727 m)   Wt 198 lb (89.8 kg)   SpO2 100%   BMI 30.11 kg/m  Wt Readings from Last 3 Encounters:  03/01/20 198 lb (89.8 kg)  02/27/20 201 lb (91.2 kg)  01/27/20 201 lb (91.2 kg)     Health Maintenance Due  Topic Date Due  . Hepatitis C Screening  Never done  . HIV Screening  Never done  . COVID-19 Vaccine (1) Never done    There are no preventive care reminders to display for this patient.  Lab Results  Component Value Date   TSH 1.44 02/27/2020   No results found for: WBC, HGB, HCT, MCV, PLT Lab Results  Component Value Date   NA 137 09/04/2019   K 4.6 09/04/2019   CO2 28 09/04/2019   GLUCOSE 106 (H) 09/04/2019   BUN 9 09/04/2019   CREATININE 0.69 09/04/2019   BILITOT 0.5 09/04/2019   AST 20 09/04/2019   ALT 17 09/04/2019   PROT 7.0 09/04/2019   CALCIUM 9.6 09/04/2019   Lab Results  Component Value Date   CHOL 218 (H) 09/04/2019   Lab Results  Component Value Date   HDL 67 09/04/2019   Lab Results  Component Value Date   LDLCALC 134 (H) 09/04/2019   Lab Results  Component Value Date   TRIG 75 09/04/2019   Lab Results  Component Value Date   CHOLHDL 3.3 09/04/2019   No results found for: HGBA1C    Assessment & Plan:   Problem List Items  Addressed This Visit      Cardiovascular and Mediastinum   Migraine headache    Followed by Dr. Tomi Likens.  Unfortunately having more headaches since her injury.  Recent increase on her Aimovig.  Hopefully this will be very helpful for her.        Other   Fibromyalgia - Primary    Discuss options in regards to her Cymbalta.  She is currently taking 2 of the 20 mg for a total of 40 mg there is a prescription to pharmacy for 60 it is up to her if she would actually like to fill it and try that for a month just for comparison sake to see if she feels like it is more helpful or less helpful certainly may be worth a try.      Relevant Orders   CBC   Cervical pain    Is planning on having disc fusion at C5-C6 with Dr. Linton Rump at [facility].  Unfortunately failed a couple of different epidurals.  Still having chronic headaches.       Other Visit Diagnoses    Encounter for hepatitis C screening test for low risk patient       Relevant Orders   Hepatitis C antibody   Leg paresthesia       Relevant Orders   CBC      No orders of the defined types were placed in this encounter.   Follow-up: Return in about 4 months (around 07/02/2020) for fibromyalgia.    Nani Gasser, MD

## 2020-03-01 NOTE — Assessment & Plan Note (Signed)
Discuss options in regards to her Cymbalta.  She is currently taking 2 of the 20 mg for a total of 40 mg there is a prescription to pharmacy for 60 it is up to her if she would actually like to fill it and try that for a month just for comparison sake to see if she feels like it is more helpful or less helpful certainly may be worth a try.

## 2020-03-01 NOTE — Assessment & Plan Note (Signed)
Is planning on having disc fusion at C5-C6 with Dr. Linton Rump at [facility].  Unfortunately failed a couple of different epidurals.  Still having chronic headaches.

## 2020-03-02 LAB — CBC
HCT: 41.2 % (ref 35.0–45.0)
Hemoglobin: 13.5 g/dL (ref 11.7–15.5)
MCH: 30.3 pg (ref 27.0–33.0)
MCHC: 32.8 g/dL (ref 32.0–36.0)
MCV: 92.6 fL (ref 80.0–100.0)
MPV: 9.2 fL (ref 7.5–12.5)
Platelets: 270 10*3/uL (ref 140–400)
RBC: 4.45 10*6/uL (ref 3.80–5.10)
RDW: 12 % (ref 11.0–15.0)
WBC: 5 10*3/uL (ref 3.8–10.8)

## 2020-03-02 LAB — HEPATITIS C ANTIBODY
Hepatitis C Ab: NONREACTIVE
SIGNAL TO CUT-OFF: 0 (ref <1.00)

## 2020-03-03 ENCOUNTER — Ambulatory Visit: Payer: Managed Care, Other (non HMO) | Admitting: Family Medicine

## 2020-03-12 ENCOUNTER — Encounter: Payer: Self-pay | Admitting: Family Medicine

## 2020-04-02 ENCOUNTER — Telehealth: Payer: Managed Care, Other (non HMO)

## 2020-04-02 ENCOUNTER — Telehealth (INDEPENDENT_AMBULATORY_CARE_PROVIDER_SITE_OTHER): Payer: Managed Care, Other (non HMO) | Admitting: Nurse Practitioner

## 2020-04-02 ENCOUNTER — Encounter: Payer: Self-pay | Admitting: Nurse Practitioner

## 2020-04-02 DIAGNOSIS — J329 Chronic sinusitis, unspecified: Secondary | ICD-10-CM | POA: Diagnosis not present

## 2020-04-02 DIAGNOSIS — J40 Bronchitis, not specified as acute or chronic: Secondary | ICD-10-CM | POA: Diagnosis not present

## 2020-04-02 MED ORDER — AMOXICILLIN-POT CLAVULANATE 875-125 MG PO TABS: 1.0000 | ORAL_TABLET | Freq: Two times a day (BID) | ORAL | 0 refills | Status: DC

## 2020-04-02 NOTE — Patient Instructions (Signed)
The following information is provided as a general resource for ADULT patients only and does NOT take into account PREGNANCY, ALLERGIES, LIVER CONDITIONS, KIDNEY CONDITIONS, GASTROINTESTINAL CONDITIONS, OR PRESCRIPTION MEDICATION INTERACTIONS. Please be sure to ask your provider if the following are safe to take with your specific medical history, conditions, or current medication regimen if you are unsure.   Adult Basic Symptom Management for Sinusitis  Congestion: Guaifenesin (Mucinex)- follow directions on packaging with a maximum dose of 2400mg in a 24 hour period.  Pain/Fever: Ibuprofen 200mg - 400mg every 4-6 hours as needed (MAX 1200mg in a 24 hour period) Pain/Fever: Tylenol 500mg -1000mg every 6-8 hours as needed (MAX 3000mg in a 24 hour period)  Cough: Dextromethorphan (Delsym)- follow directions on packing with a maximum dose of 120mg in a 24 hour period.  Nasal Stuffiness: Saline nasal spray and/or Nettie Pot with sterile saline solution  Runny Nose: Fluticasone nasal spray (Flonase) OR Mometasone nasal spray (Nasonex) OR Triamcinolone Acetonide nasal spray (Nasacort)- follow directions on the packaging  Pain/Pressure: Warm washcloth to the face  Sore Throat: Warm salt water gargles  If you have allergies, you may also consider taking an oral antihistamine (like Zyrtec or Claritin) as these may also help with your symptoms.  **Many medications will have more than one ingredient, be sure you are reading the packaging carefully and not taking more than one dose of the same kind of medication at the same time or too close together. It is OK to use formulas that have all of the ingredients you want, but do not take them in a combined medication and as separate dose too close together. If you have any questions, the pharmacist will be happy to help you decide what is safe.    

## 2020-04-02 NOTE — Progress Notes (Signed)
Virtual Video Visit via MyChart Note  I connected with  CARALEE MOREA on 04/02/20 at  3:30 PM EDT by the video enabled telemedicine application for , MyChart, and verified that I am speaking with the correct person using two identifiers.   I introduced myself as a Publishing rights manager with the practice. We discussed the limitations of evaluation and management by telemedicine and the availability of in person appointments. The patient expressed understanding and agreed to proceed.  The patient is: in her car I am: in the office  Subjective:    CC:  Chief Complaint  Patient presents with  . URI    Onset:11d, HA, sneezing, sinus pressure, productive cough, went to [facility] and was told it was the start of bronchitis and took prednisone for 5 days and was given a cough med, has been taking Dayquil/Nyquil with minimal relief, mucus is now green, ear fullness/popping, sinus congestion, fatigue, was tested for COVID last week and it was negative, eye pressure, facial pressure, PND, ST    HPI: MARSHAYLA MITSCHKE is a 54 y.o. y/o female presenting via MyChart today for upper respiratory symptoms that have been present for approximately 12 days. She reports productive, frequent cough, chest and head congestion, subjective fever, night sweats, sinus pain and pressure, sore throat, sneezing, rhinorrhea with significant green mucous production, loss of taste and smell (that has since returned), and headache.  She did go to the [facility] for COVID-19 testing and this was negative. They refilled her albuterol inhaler and gave her tessalon perles and a prednisone burst for her symptoms. She reports she has continued to have symptoms that just won't resolve. Her husband is sick with the same symptoms, as well.   She has been taking dayquil and nyquil with some relief.  She reports that she does feel tightness and congestion in her chest, but is not short of breath and not experiencing any chest  pain.   She has a history of pneumonia several times and bronchitis.   Past medical history, Surgical history, Family history not pertinant except as noted below, Social history, Allergies, and medications have been entered into the medical record, reviewed, and corrections made.   Review of Systems:  See HPI for pertinent positive and negatives  Objective:    General: Speaking clearly in complete sentences without any shortness of breath.   Alert and oriented x3.   Normal judgment.  No apparent acute distress. She is audibly congested and does have a productive, frequent cough.   Impression and Recommendations:   1. Sinobronchitis Symptoms and presentation consistent with sinobronchitis. Given the length of time and severity of symptoms, we will start antibiotic therapy. Given her history of pneumonia, we discussed if her symptoms persist into next week we may need to obtain a chest x-ray and try another round of steroid burst.   PLAN: _Augmenting twice a day for 7 days. -Continue supportive therapy with OTC medications including DayQuil, NyQuil, Tylenol, Ibuprofen, Mucinex, etc.  -You may also add flonase to the medication regimen to help with nasal inflammation and discharge. -If not better by Haillee Johann next week, notify my through mychart and I will place an order for chest xray and prednisone burst -If you begin to feel short of breath, dizzy, or have difficulty breathing please seek emergency care  -Follow-up if symptoms worsen or fail to improve.   - amoxicillin-clavulanate (AUGMENTIN) 875-125 MG tablet; Take 1 tablet by mouth 2 (two) times daily.  Dispense: 14 tablet; Refill:  0     I discussed the assessment and treatment plan with the patient. The patient was provided an opportunity to ask questions and all were answered. The patient agreed with the plan and demonstrated an understanding of the instructions.   The patient was advised to call back or seek an in-person  evaluation if the symptoms worsen or if the condition fails to improve as anticipated.  I provided 20 minutes of non-face-to-face interaction with this [place] visit including intake, same-day documentation, and chart review.   Orma Render, NP

## 2020-04-27 ENCOUNTER — Encounter: Payer: Self-pay | Admitting: Neurology

## 2020-04-27 ENCOUNTER — Ambulatory Visit: Payer: Managed Care, Other (non HMO) | Admitting: Neurology

## 2020-04-27 ENCOUNTER — Other Ambulatory Visit: Payer: Self-pay

## 2020-04-27 ENCOUNTER — Ambulatory Visit (INDEPENDENT_AMBULATORY_CARE_PROVIDER_SITE_OTHER): Payer: Managed Care, Other (non HMO) | Admitting: Neurology

## 2020-04-27 DIAGNOSIS — R200 Anesthesia of skin: Secondary | ICD-10-CM | POA: Diagnosis not present

## 2020-04-27 DIAGNOSIS — R202 Paresthesia of skin: Secondary | ICD-10-CM

## 2020-04-27 DIAGNOSIS — G5602 Carpal tunnel syndrome, left upper limb: Secondary | ICD-10-CM

## 2020-04-27 HISTORY — DX: Carpal tunnel syndrome, left upper limb: G56.02

## 2020-04-27 NOTE — Progress Notes (Signed)
[facility]    Nerve / Sites Muscle Latency Ref. Amplitude Ref. Rel Amp Segments Distance Velocity Ref. Area    ms ms mV mV %  cm m/s m/s mVms  R Median - APB     Wrist APB 3.8 ?4.4 5.8 ?4.0 100 Wrist - APB 7   21.2     Upper arm APB 8.0  6.3  109 Upper arm - Wrist 24 57 ?49 22.0  L Median - APB     Wrist APB 5.5 ?4.4 3.7 ?4.0 100 Wrist - APB 7   15.5     Upper arm APB 10.1  5.9  159 Upper arm - Wrist 24 52 ?49 18.5  R Ulnar - ADM     Wrist ADM 3.1 ?3.3 10.8 ?6.0 100 Wrist - ADM 7   23.7     B.Elbow ADM 6.4  6.0  55.3 B.Elbow - Wrist 21 63 ?49 15.3     A.Elbow ADM 7.9  5.8  97.7 A.Elbow - B.Elbow 10 66 ?49 15.1         A.Elbow - Wrist      L Ulnar - ADM     Wrist ADM 2.8 ?3.3 9.2 ?6.0 100 Wrist - ADM 7   28.1     B.Elbow ADM 6.1  6.7  72.8 B.Elbow - Wrist 19 57 ?49 21.5     A.Elbow ADM 7.7  6.9  102 A.Elbow - B.Elbow 10 62 ?49 21.0         A.Elbow - Wrist      R Peroneal - EDB     Ankle EDB 4.0 ?6.5 6.1 ?2.0 100 Ankle - EDB 9   20.7     Fib head EDB 10.5  5.4  88.2 Fib head - Ankle 32 49 ?44 20.0     Pop fossa EDB 12.5  4.9  90.4 Pop fossa - Fib head 10 51 ?44 18.3         Pop fossa - Ankle      L Peroneal - EDB     Ankle EDB 4.5 ?6.5 6.4 ?2.0 100 Ankle - EDB 9   23.4     Fib head EDB 11.2  5.5  85.2 Fib head - Ankle 30 45 ?44 22.8     Pop fossa EDB 13.1  5.2  94.3 Pop fossa - Fib head 10 51 ?44 22.1         Pop fossa - Ankle      R Tibial - AH     Ankle AH 3.6 ?5.8 12.9 ?4.0 100 Ankle - AH 9   22.2     Pop fossa AH 13.5  9.3  72.3 Pop fossa - Ankle 41 41 ?41 18.3  L Tibial - AH     Ankle AH 4.4 ?5.8 7.8 ?4.0 100 Ankle - AH 9   20.5     Pop fossa AH 14.0  6.0  77.4 Pop fossa - Ankle 39 41 ?41 15.8                     SNC    Nerve / Sites Rec. Site Peak Lat Ref.  Amp Ref. Segments Distance    ms ms V V  cm  R Sural - Ankle (Calf)     Calf Ankle 3.4 ?4.4 8 ?6 Calf - Ankle 14  L Sural - Ankle (Calf)     Calf Ankle 3.4 ?4.4 7 ?6 Calf - Ankle 14  R  Superficial peroneal - Ankle      Lat leg Ankle 3.8 ?4.4 3 ?6 Lat leg - Ankle 14  L Superficial peroneal - Ankle     Lat leg Ankle 3.9 ?4.4 3 ?6 Lat leg - Ankle 14  R Median - Orthodromic (Dig II, Mid palm)     Dig II Wrist 3.5 ?3.4 13 ?10 Dig II - Wrist 13  L Median - Orthodromic (Dig II, Mid palm)     Dig II Wrist 4.3 ?3.4 4 ?10 Dig II - Wrist 13  R Ulnar - Orthodromic, (Dig V, Mid palm)     Dig V Wrist 2.7 ?3.1 11 ?5 Dig V - Wrist 11  L Ulnar - Orthodromic, (Dig V, Mid palm)     Dig V Wrist 2.8 ?3.1 10 ?5 Dig V - Wrist 38                     F  Wave    Nerve F Lat Ref.   ms ms  R Tibial - AH 50.6 ?56.0  L Tibial - AH 50.0 ?56.0  R Ulnar - ADM 26.5 ?32.0  L Ulnar - ADM 27.6 ?32.0

## 2020-04-27 NOTE — Procedures (Signed)
     HISTORY:  Denise Ramirez is a 54 year old patient with a history of a work-related injury that occurred in December 2020 when she was hit by a falling box at work.  She has had some neck pain in the left upper extremity discomfort since that time.  She reports an 18-year history of intermittent left lower extremity pain that began following a deep venous thrombosis involving the leg.  She reports some numbness in both feet.  She is being evaluated for a possible neuropathy or a lumbosacral radiculopathy.  NERVE CONDUCTION STUDIES:  Nerve conduction studies were performed on both upper extremities.  The distal motor latencies for the median nerves were prolonged on the left and normal on the right with normal motor amplitude on the right and low motor amplitude on the left.  The distal motor latencies and motor amplitudes for the ulnar nerves were normal bilaterally with normal nerve conduction velocities seen for the ulnar nerves and for the median nerves bilaterally.  The sensory latencies for the median nerves were prolonged bilaterally and were normal for the ulnar nerves bilaterally.  The F-wave latencies for the ulnar nerves were within normal limits bilaterally.  Nerve conduction studies were performed on both lower extremities. The distal motor latencies and motor amplitudes for the peroneal and posterior tibial nerves were within normal limits. The nerve conduction velocities for these nerves were also normal. The sensory latencies for the peroneal and sural nerves were within normal limits. The F wave latencies for the posterior tibial nerves were within normal limits.   EMG STUDIES:  EMG study was performed on the left lower extremity:  The tibialis anterior muscle reveals 2 to 4K motor units with full recruitment. No fibrillations or positive waves were seen. The peroneus tertius muscle reveals 2 to 4K motor units with full recruitment. No fibrillations or positive waves were  seen. The medial gastrocnemius muscle reveals 1 to 3K motor units with full recruitment. No fibrillations or positive waves were seen. The vastus lateralis muscle reveals 2 to 4K motor units with full recruitment. No fibrillations or positive waves were seen. The iliopsoas muscle reveals 2 to 4K motor units with full recruitment. No fibrillations or positive waves were seen. The biceps femoris muscle (long head) reveals 2 to 4K motor units with full recruitment. No fibrillations or positive waves were seen. The lumbosacral paraspinal muscles were tested at 3 levels, and revealed no abnormalities of insertional activity at all 3 levels tested. There was good relaxation.   IMPRESSION:  Nerve conduction studies done on all 4 extremities shows evidence of a mild to moderate severity left carpal tunnel syndrome.  There is evidence of a borderline right carpal tunnel syndrome.  There is no evidence of a generalized peripheral neuropathy.  EMG evaluation of the left lower extremity was unremarkable without evidence of an overlying lumbosacral radiculopathy.  Marlan Palau MD 04/27/2020 3:34 PM  [facility] [address] [place], [place] [address]  Phone [phone] Fax [phone]

## 2020-04-27 NOTE — Progress Notes (Signed)
Please refer to EMG and nerve conduction procedure note.  

## 2020-04-28 ENCOUNTER — Telehealth: Payer: Self-pay

## 2020-04-28 NOTE — Telephone Encounter (Signed)
Left VM with results of EMG and MRI per Dr Everlena Cooper - Nerve study shows evidence of carpal tunnel syndrome in the left upper extremity. If she hasn't already, I would recommend wearing a wrist splint on the left wrist to see if some of the numbness and discomfort improves (at least wear it at bedtime and as much during the day as possible). Otherwise, unremarkable. No evidence of nerve damage in the legs. Given the unremarkable MRI of lumbar spine, I have no explanation for those symptoms in the legs.

## 2020-05-10 ENCOUNTER — Other Ambulatory Visit: Payer: Self-pay

## 2020-05-10 ENCOUNTER — Ambulatory Visit (INDEPENDENT_AMBULATORY_CARE_PROVIDER_SITE_OTHER): Payer: Managed Care, Other (non HMO) | Admitting: Nurse Practitioner

## 2020-05-10 ENCOUNTER — Encounter: Payer: Self-pay | Admitting: Nurse Practitioner

## 2020-05-10 VITALS — BP 110/67 | HR 100 | Temp 98.0°F | Wt 205.0 lb

## 2020-05-10 DIAGNOSIS — N3090 Cystitis, unspecified without hematuria: Secondary | ICD-10-CM

## 2020-05-10 LAB — POCT URINALYSIS DIP (CLINITEK)
Bilirubin, UA: NEGATIVE
Glucose, UA: NEGATIVE mg/dL
Ketones, POC UA: NEGATIVE mg/dL
Nitrite, UA: NEGATIVE
Spec Grav, UA: 1.01 (ref 1.010–1.025)
Urobilinogen, UA: 0.2 E.U./dL
pH, UA: 6 (ref 5.0–8.0)

## 2020-05-10 MED ORDER — NITROFURANTOIN MONOHYD MACRO 100 MG PO CAPS: 100.0000 mg | ORAL_CAPSULE | Freq: Two times a day (BID) | ORAL | 0 refills | Status: DC

## 2020-05-10 NOTE — Progress Notes (Signed)
Acute Office Visit  Subjective:    Patient ID: Denise Ramirez, female    DOB: [birth year], 54 y.o.   MRN: [id number]  Chief Complaint  Patient presents with  . Urinary Tract Infection    HPI Patient is in today for lower abdominal pressure and discomfort, body aches, chills, low back pain, headache, feeling of incomplete emptying, and frequent urination that started yesterday morning. She reports her symptoms were initially mild, but increased significantly throughout the day. She reports that she was up about every hour throughout the night to urinate.   She endorses drinking plenty of water (over a gallon a day) and denies frequent UTI's. She denies known fever or sharp, waxing and waning flank or abdominal pain.   Past Medical History:  Diagnosis Date  . Left carpal tunnel syndrome 04/27/2020  . 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 BREAST BIOPSY Left   . BREAST EXCISIONAL BIOPSY Left   . CARPAL TUNNEL RELEASE Right   . DILATION AND CURETTAGE OF UTERUS     x 3  . THYROID CYST EXCISION    . TONSILLECTOMY  age 62  . VAGINAL HYSTERECTOMY  02/2018    Family History  Adopted: Yes    Social History   Socioeconomic History  . Marital status: Married    Spouse name: Matt  . Number of children: 3  . Years of education: Not on file  . Highest education level: Associate degree: academic program  Occupational History  . Occupation: Occupational hygienist  Tobacco Use  . Smoking status: Never Smoker  . Smokeless tobacco: Never Used  Vaping Use  . Vaping Use: Never used  Substance and Sexual Activity  . Alcohol use: Yes  . Drug use: Never  . Sexual activity: Yes    Partners: Male  Other Topics Concern  . Not on file  Social History Narrative   Patient was born left-handed. She was trained in parochial school to use her right hand. She uses her left hand to eat. She lives with her husband, they are closing on a 2  story house, staying with relatives for the time being. She drinks  2 cups of coffee a day, and rare soda. She participates in weight training twice a week.   Social Determinants of Health   Financial Resource Strain:   . Difficulty of Paying Living Expenses:   Food Insecurity:   . Worried About Programme researcher, broadcasting/film/video in the Last Year:   . Barista in the Last Year:   Transportation Needs:   . Freight forwarder (Medical):   Marland Kitchen Lack of Transportation (Non-Medical):   Physical Activity:   . Days of Exercise per Week:   . Minutes of Exercise per Session:   Stress:   . Feeling of Stress :   Social Connections:   . Frequency of Communication with Friends and Family:   . Frequency of Social Gatherings with Friends and Family:   . Attends Religious Services:   . Active Member of Clubs or Organizations:   . Attends Banker Meetings:   Marland Kitchen Marital Status:   Intimate Partner Violence:   . Fear of Current or Ex-Partner:   . Emotionally Abused:   Marland Kitchen Physically Abused:   . Sexually Abused:     Outpatient Medications Prior to Visit  Medication Sig Dispense Refill  . acetaminophen (TYLENOL) 500 MG tablet Take  500 mg by mouth every 6 (six) hours as needed for moderate pain or headache.     . albuterol (VENTOLIN HFA) 108 (90 Base) MCG/ACT inhaler Inhale 1-2 puffs into the lungs every 4 (four) hours as needed.    Marland Kitchen amoxicillin-clavulanate (AUGMENTIN) 875-125 MG tablet Take 1 tablet by mouth 2 (two) times daily. 14 tablet 0  . benzonatate (TESSALON) 100 MG capsule Take 100 mg by mouth 3 (three) times daily.    . DULoxetine (CYMBALTA) 60 MG capsule Take 1 capsule (60 mg total) by mouth daily. (Patient taking differently: Take 40 mg by mouth daily. ) 1 capsule 0  . Erenumab-aooe (AIMOVIG) 140 MG/ML SOAJ Inject 140 mg into the skin every 28 (twenty-eight) days. 1 pen 11  . Multiple Vitamin (MULTIVITAMIN) tablet Take 1 tablet by mouth daily.    . pantoprazole (PROTONIX) 40 MG  tablet Take 40 mg by mouth daily.      No facility-administered medications prior to visit.    Allergies  Allergen Reactions  . Morphine Nausea And Vomiting and Swelling  . Morphine And Related Nausea And Vomiting and Swelling      Objective:    Physical Exam Vitals and nursing note reviewed.  Constitutional:      Appearance: Normal appearance. She is normal weight.  HENT:     Head: Normocephalic.  Eyes:     Extraocular Movements: Extraocular movements intact.     Conjunctiva/sclera: Conjunctivae normal.     Pupils: Pupils are equal, round, and reactive to light.  Cardiovascular:     Rate and Rhythm: Normal rate.     Pulses: Normal pulses.     Heart sounds: Normal heart sounds.  Pulmonary:     Effort: Pulmonary effort is normal.     Breath sounds: Normal breath sounds.  Abdominal:     General: Bowel sounds are normal. There is distension.     Palpations: Abdomen is soft.     Tenderness: There is abdominal tenderness. There is no right CVA tenderness, left CVA tenderness or rebound.  Musculoskeletal:        General: Normal range of motion.     Cervical back: Normal range of motion and neck supple.     Right lower leg: No edema.     Left lower leg: No edema.  Skin:    General: Skin is warm and dry.     Capillary Refill: Capillary refill takes less than 2 seconds.  Neurological:     General: No focal deficit present.     Mental Status: She is alert and oriented to person, place, and time.  Psychiatric:        Mood and Affect: Mood normal.        Behavior: Behavior normal.        Thought Content: Thought content normal.        Judgment: Judgment normal.     There were no vitals taken for this visit. Wt Readings from Last 3 Encounters:  03/01/20 198 lb (89.8 kg)  02/27/20 201 lb (91.2 kg)  01/27/20 201 lb (91.2 kg)    Health Maintenance Due  Topic Date Due  . COVID-19 Vaccine (1) Never done  . HIV Screening  Never done    There are no preventive care  reminders to display for this patient.   Lab Results  Component Value Date   TSH 1.44 02/27/2020   Lab Results  Component Value Date   WBC 5.0 03/01/2020   HGB 13.5 03/01/2020  HCT 41.2 03/01/2020   MCV 92.6 03/01/2020   PLT 270 03/01/2020   Lab Results  Component Value Date   NA 137 09/04/2019   K 4.6 09/04/2019   CO2 28 09/04/2019   GLUCOSE 106 (H) 09/04/2019   BUN 9 09/04/2019   CREATININE 0.69 09/04/2019   BILITOT 0.5 09/04/2019   AST 20 09/04/2019   ALT 17 09/04/2019   PROT 7.0 09/04/2019   CALCIUM 9.6 09/04/2019   Lab Results  Component Value Date   CHOL 218 (H) 09/04/2019   Lab Results  Component Value Date   HDL 67 09/04/2019   Lab Results  Component Value Date   LDLCALC 134 (H) 09/04/2019   Lab Results  Component Value Date   TRIG 75 09/04/2019   Lab Results  Component Value Date   CHOLHDL 3.3 09/04/2019   No results found for: HGBA1C     Assessment & Plan:   1. Cystitis Symptoms and presentation consistent with acute cystitis. In office UA confirmed the presence of blood and leukocytes. Will plan to treat with 5 day course of macrobid and recommend OTC AZO Uti to help with discomfort.   PLAN: - nitrofurantoin, macrocrystal-monohydrate, (MACROBID) 100 MG capsule; Take 1 capsule (100 mg total) by mouth 2 (two) times daily.  Dispense: 10 capsule; Refill: 0 - POCT Urinalysis Dipstick - May use AZO maximum strength UTI as directed on the box to help with discomfort and other symptoms.  - Please complete full course of antibiotic - let me know if symptoms of yeast infection arise - Follow-up if symptoms worsen or fail to improve.   Tollie Eth, NP

## 2020-05-10 NOTE — Patient Instructions (Signed)
Maximum strength AZO Uti formula will help with symptoms. This can be purchased over the counter at the pharmacy.  I have called in a medication called Macrobid. You will take this twice a day for 5 days.   Continue to drink plenty of water.    Urinary Tract Infection, Adult  A urinary tract infection (UTI) is an infection of any part of the urinary tract. The urinary tract includes the kidneys, ureters, bladder, and urethra. These organs make, store, and get rid of urine in the body. Your health care provider may use other names to describe the infection. An upper UTI affects the ureters and kidneys (pyelonephritis). A lower UTI affects the bladder (cystitis) and urethra (urethritis). What are the causes? Most urinary tract infections are caused by bacteria in your genital area, around the entrance to your urinary tract (urethra). These bacteria grow and cause inflammation of your urinary tract. What increases the risk? You are more likely to develop this condition if:  You have a urinary catheter that stays in place (indwelling).  You are not able to control when you urinate or have a bowel movement (you have incontinence).  You are female and you: ? Use a spermicide or diaphragm for birth control. ? Have low estrogen levels. ? Are pregnant.  You have certain genes that increase your risk (genetics).  You are sexually active.  You take antibiotic medicines.  You have a condition that causes your flow of urine to slow down, such as: ? An enlarged prostate, if you are female. ? Blockage in your urethra (stricture). ? A kidney stone. ? A nerve condition that affects your bladder control (neurogenic bladder). ? Not getting enough to drink, or not urinating often.  You have certain medical conditions, such as: ? Diabetes. ? A weak disease-fighting system (immunesystem). ? Sickle cell disease. ? Gout. ? Spinal cord injury. What are the signs or symptoms? Symptoms of this  condition include:  Needing to urinate right away (urgently).  Frequent urination or passing small amounts of urine frequently.  Pain or burning with urination.  Blood in the urine.  Urine that smells bad or unusual.  Trouble urinating.  Cloudy urine.  Vaginal discharge, if you are female.  Pain in the abdomen or the lower back. You may also have:  Vomiting or a decreased appetite.  Confusion.  Irritability or tiredness.  A fever.  Diarrhea. The first symptom in older adults may be confusion. In some cases, they may not have any symptoms until the infection has worsened. How is this diagnosed? This condition is diagnosed based on your medical history and a physical exam. You may also have other tests, including:  Urine tests.  Blood tests.  Tests for sexually transmitted infections (STIs). If you have had more than one UTI, a cystoscopy or imaging studies may be done to determine the cause of the infections. How is this treated? Treatment for this condition includes:  Antibiotic medicine.  Over-the-counter medicines to treat discomfort.  Drinking enough water to stay hydrated. If you have frequent infections or have other conditions such as a kidney stone, you may need to see a health care provider who specializes in the urinary tract (urologist). In rare cases, urinary tract infections can cause sepsis. Sepsis is a life-threatening condition that occurs when the body responds to an infection. Sepsis is treated in the hospital with IV antibiotics, fluids, and other medicines. Follow these instructions at home:  Medicines  Take over-the-counter and prescription medicines only  as told by your health care provider.  If you were prescribed an antibiotic medicine, take it as told by your health care provider. Do not stop using the antibiotic even if you start to feel better. General instructions  Make sure you: ? Empty your bladder often and completely. Do not  hold urine for long periods of time. ? Empty your bladder after sex. ? Wipe from front to back after a bowel movement if you are female. Use each tissue one time when you wipe.  Drink enough fluid to keep your urine pale yellow.  Keep all follow-up visits as told by your health care provider. This is important. Contact a health care provider if:  Your symptoms do not get better after 1-2 days.  Your symptoms go away and then return. Get help right away if you have:  Severe pain in your back or your lower abdomen.  A fever.  Nausea or vomiting. Summary  A urinary tract infection (UTI) is an infection of any part of the urinary tract, which includes the kidneys, ureters, bladder, and urethra.  Most urinary tract infections are caused by bacteria in your genital area, around the entrance to your urinary tract (urethra).  Treatment for this condition often includes antibiotic medicines.  If you were prescribed an antibiotic medicine, take it as told by your health care provider. Do not stop using the antibiotic even if you start to feel better.  Keep all follow-up visits as told by your health care provider. This is important. This information is not intended to replace advice given to you by your health care provider. Make sure you discuss any questions you have with your health care provider. Document Revised: 09/26/2018 Document Reviewed: 04/18/2018 Elsevier Patient Education  2020 [employer].

## 2020-05-12 ENCOUNTER — Encounter: Payer: Self-pay | Admitting: Neurology

## 2020-05-12 NOTE — Progress Notes (Signed)
Denise Ramirez ([name]) Rx #: [id number] Aimovig 140MG /ML auto-injectors   Form [employer] Electronic PA Form (2017 NCPDP) Created 11 hours ago Sent to Plan 3 minutes ago Plan Response 2 minutes ago Submit Clinical Questions 1 minute ago Determination Favorable less than a minute ago Message from Plan CaseId:[id number];Status:Approved;Review Type:Prior Auth;Coverage Start Date:04/22/2020;Coverage End Date:05/12/2021;

## 2020-05-13 ENCOUNTER — Encounter: Payer: Self-pay | Admitting: Family Medicine

## 2020-05-13 ENCOUNTER — Other Ambulatory Visit: Payer: Self-pay

## 2020-05-13 ENCOUNTER — Ambulatory Visit (INDEPENDENT_AMBULATORY_CARE_PROVIDER_SITE_OTHER): Payer: Managed Care, Other (non HMO) | Admitting: Family Medicine

## 2020-05-13 VITALS — BP 125/45 | HR 86 | Ht 68.0 in | Wt 206.0 lb

## 2020-05-13 DIAGNOSIS — M797 Fibromyalgia: Secondary | ICD-10-CM

## 2020-05-13 DIAGNOSIS — G4700 Insomnia, unspecified: Secondary | ICD-10-CM | POA: Diagnosis not present

## 2020-05-13 DIAGNOSIS — M79605 Pain in left leg: Secondary | ICD-10-CM

## 2020-05-13 DIAGNOSIS — R3129 Other microscopic hematuria: Secondary | ICD-10-CM | POA: Diagnosis not present

## 2020-05-13 MED ORDER — DULOXETINE HCL 20 MG PO CPEP: 40.0000 mg | ORAL_CAPSULE | Freq: Every day | ORAL | 3 refills | Status: DC

## 2020-05-13 MED ORDER — AMITRIPTYLINE HCL 25 MG PO TABS: 25.0000 mg | ORAL_TABLET | Freq: Every day | ORAL | 0 refills | Status: DC

## 2020-05-13 NOTE — Assessment & Plan Note (Signed)
Resistant left leg pain likely coming from the lumbar spine.  She could certainly consider the epidural I think it probably is worth a try at this point just to see if it is helpful even if it is temporarily helpful.  Nerve conductions were normal which is reassuring that there is no other degenerative process going on.  Hopefully the addition of the amitriptyline at night will be helpful.

## 2020-05-13 NOTE — Assessment & Plan Note (Signed)
We discussed options for her sleep including trazodone versus a TCA versus a sleep aid such as Ambien or Lunesta.  Discussed pros and cons of these options.  Will start with trial of amitriptyline.  We will need to decrease her Cymbalta dose.  Since she does not notice an improvement in efficacy from 40 mg to 60 mg we will go ahead and decrease her dose down to 40 mg so there is less potential interaction with the amitriptyline.  Again we will start with a low-dose of 25 mg.  Like to see her back in about 3 to 4 weeks before her refills of that we can make adjustments and see if she feels like it medication is helping.

## 2020-05-13 NOTE — Progress Notes (Signed)
Established Patient Office Visit  Subjective:  Patient ID: Denise Ramirez, female    DOB: 07-Oct-1966  Age: 54 y.o. MRN: [id number]  CC:  Chief Complaint  Patient presents with  . Follow-up    HPI ANNALISE Ramirez presents for follow-up on several issues.  She is still really struggling with pain.  Initial injury was in December of last year she was out of work for a few months and then try to go back to work for couple months but continued to have worsening cervical pain and numbness and tingling down the left arm and pain in her left leg.  After physical therapy, 2 cervical epidurals and a nerve block she did not have significant relief in symptoms of they had discussed surgery including potential fusion of C5-C6.  After much thought she is not sure that she wants to have surgery at this point.  We did try going up on her Cymbalta from 40 mg to 60 mg just to see if this was helpful.  She really has not noticed a big difference in fact it just has made her have a little bit worsening dry mouth.  She is also tried gabapentin and that was not helpful either.   She is still not sleeping well.  He left leg pain in particular has been keeping her awake and makes it very difficult to get comfortable she is also having difficulty getting comfortable with her neck as well.  Because of the poor sleep quality she sometimes ends up taking naps during the day and falling asleep.  She has had nerve conduction studies performed on both lower extremities.  These are within normal limits.  She was actually scheduled for an epidural tomorrow for the lumbar spine area but canceled it.  Past Medical History:  Diagnosis Date  . Left carpal tunnel syndrome 04/27/2020  . 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 BREAST BIOPSY Left   . BREAST EXCISIONAL BIOPSY Left   . CARPAL TUNNEL RELEASE Right   . DILATION AND CURETTAGE OF UTERUS     x 3  .  THYROID CYST EXCISION    . TONSILLECTOMY  age 16  . VAGINAL HYSTERECTOMY  02/2018    Family History  Adopted: Yes    Social History   Socioeconomic History  . Marital status: Married    Spouse name: Matt  . Number of children: 3  . Years of education: Not on file  . Highest education level: Associate degree: academic program  Occupational History  . Occupation: Occupational hygienist  Tobacco Use  . Smoking status: Never Smoker  . Smokeless tobacco: Never Used  Vaping Use  . Vaping Use: Never used  Substance and Sexual Activity  . Alcohol use: Yes  . Drug use: Never  . Sexual activity: Yes    Partners: Male  Other Topics Concern  . Not on file  Social History Narrative   Patient was born left-handed. She was trained in parochial school to use her right hand. She uses her left hand to eat. She lives with her husband, they are closing on a 2 story house, staying with relatives for the time being. She drinks  2 cups of coffee a day, and rare soda. She participates in weight training twice a week.   Social Determinants of Health   Financial Resource Strain:   . Difficulty of Paying Living Expenses:  Food Insecurity:   . Worried About Programme researcher, broadcasting/film/videounning Out of Food in the Last Year:   . Baristaan Out of Food in the Last Year:   Transportation Needs:   . Freight forwarderLack of Transportation (Medical):   Marland Kitchen. Lack of Transportation (Non-Medical):   Physical Activity:   . Days of Exercise per Week:   . Minutes of Exercise per Session:   Stress:   . Feeling of Stress :   Social Connections:   . Frequency of Communication with Friends and Family:   . Frequency of Social Gatherings with Friends and Family:   . Attends Religious Services:   . Active Member of Clubs or Organizations:   . Attends BankerClub or Organization Meetings:   Marland Kitchen. Marital Status:   Intimate Partner Violence:   . Fear of Current or Ex-Partner:   . Emotionally Abused:   Marland Kitchen. Physically Abused:   . Sexually Abused:     Outpatient Medications Prior  to Visit  Medication Sig Dispense Refill  . acetaminophen (TYLENOL) 500 MG tablet Take 500 mg by mouth every 6 (six) hours as needed for moderate pain or headache.     . albuterol (VENTOLIN HFA) 108 (90 Base) MCG/ACT inhaler Inhale 1-2 puffs into the lungs every 4 (four) hours as needed.    . Cyanocobalamin (B-12 PO) Take 1 tablet by mouth daily.    Dorise Hiss. Erenumab-aooe (AIMOVIG) 140 MG/ML SOAJ Inject 140 mg into the skin every 28 (twenty-eight) days. 1 pen 11  . Multiple Vitamin (MULTIVITAMIN) tablet Take 1 tablet by mouth daily.    . pantoprazole (PROTONIX) 40 MG tablet Take 40 mg by mouth daily.     . DULoxetine (CYMBALTA) 60 MG capsule Take 1 capsule (60 mg total) by mouth daily. (Patient taking differently: Take 40 mg by mouth daily. ) 1 capsule 0  . amoxicillin-clavulanate (AUGMENTIN) 875-125 MG tablet Take 1 tablet by mouth 2 (two) times daily. (Patient not taking: Reported on 05/10/2020) 14 tablet 0  . benzonatate (TESSALON) 100 MG capsule Take 100 mg by mouth 3 (three) times daily. (Patient not taking: Reported on 05/10/2020)    . nitrofurantoin, macrocrystal-monohydrate, (MACROBID) 100 MG capsule Take 1 capsule (100 mg total) by mouth 2 (two) times daily. 10 capsule 0   No facility-administered medications prior to visit.    Allergies  Allergen Reactions  . Morphine Nausea And Vomiting and Swelling  . Morphine And Related Nausea And Vomiting and Swelling    ROS Review of Systems    Objective:    Physical Exam Vitals reviewed.  Constitutional:      Appearance: She is well-developed.  HENT:     Head: Normocephalic and atraumatic.  Eyes:     Conjunctiva/sclera: Conjunctivae normal.  Cardiovascular:     Rate and Rhythm: Normal rate.  Pulmonary:     Effort: Pulmonary effort is normal.  Skin:    General: Skin is dry.     Coloration: Skin is not pale.  Neurological:     Mental Status: She is alert and oriented to person, place, and time.  Psychiatric:        Behavior:  Behavior normal.     BP (!) 125/45   Pulse 86   Ht 5\' 8"  (1.727 m)   Wt (!) 206 lb (93.4 kg)   SpO2 98%   BMI 31.32 kg/m  Wt Readings from Last 3 Encounters:  05/13/20 (!) 206 lb (93.4 kg)  05/10/20 205 lb (93 kg)  03/01/20 198 lb (89.8 kg)  There are no preventive care reminders to display for this patient.  There are no preventive care reminders to display for this patient.  Lab Results  Component Value Date   TSH 1.44 02/27/2020   Lab Results  Component Value Date   WBC 5.0 03/01/2020   HGB 13.5 03/01/2020   HCT 41.2 03/01/2020   MCV 92.6 03/01/2020   PLT 270 03/01/2020   Lab Results  Component Value Date   NA 137 09/04/2019   K 4.6 09/04/2019   CO2 28 09/04/2019   GLUCOSE 106 (H) 09/04/2019   BUN 9 09/04/2019   CREATININE 0.69 09/04/2019   BILITOT 0.5 09/04/2019   AST 20 09/04/2019   ALT 17 09/04/2019   PROT 7.0 09/04/2019   CALCIUM 9.6 09/04/2019   Lab Results  Component Value Date   CHOL 218 (H) 09/04/2019   Lab Results  Component Value Date   HDL 67 09/04/2019   Lab Results  Component Value Date   LDLCALC 134 (H) 09/04/2019   Lab Results  Component Value Date   TRIG 75 09/04/2019   Lab Results  Component Value Date   CHOLHDL 3.3 09/04/2019   No results found for: HGBA1C    Assessment & Plan:   Problem List Items Addressed This Visit      Other   Left leg pain    Resistant left leg pain likely coming from the lumbar spine.  She could certainly consider the epidural I think it probably is worth a try at this point just to see if it is helpful even if it is temporarily helpful.  Nerve conductions were normal which is reassuring that there is no other degenerative process going on.  Hopefully the addition of the amitriptyline at night will be helpful.      Insomnia - Primary    We discussed options for her sleep including trazodone versus a TCA versus a sleep aid such as Ambien or Lunesta.  Discussed pros and cons of these  options.  Will start with trial of amitriptyline.  We will need to decrease her Cymbalta dose.  Since she does not notice an improvement in efficacy from 40 mg to 60 mg we will go ahead and decrease her dose down to 40 mg so there is less potential interaction with the amitriptyline.  Again we will start with a low-dose of 25 mg.  Like to see her back in about 3 to 4 weeks before her refills of that we can make adjustments and see if she feels like it medication is helping.      Fibromyalgia   Relevant Medications   DULoxetine (CYMBALTA) 20 MG capsule   amitriptyline (ELAVIL) 25 MG tablet    Other Visit Diagnoses    Microscopic hematuria          Recent UTI-she still has about 2 days of antibiotics to complete and is feeling significantly better she still just has a little bit of soreness in her back which was quite intense several days ago but again she is feeling better.  We discussed that we can always repeat the urinalysis 1 week after completing antibiotics just to make sure that we have fully treated the infection.  Plus she had blood on the urinalysis so I do want to make sure that that resolves completely.  Meds ordered this encounter  Medications  . DULoxetine (CYMBALTA) 20 MG capsule    Sig: Take 2 capsules (40 mg total) by mouth daily.    Dispense:  60 capsule    Refill:  3  . amitriptyline (ELAVIL) 25 MG tablet    Sig: Take 1 tablet (25 mg total) by mouth at bedtime.    Dispense:  30 tablet    Refill:  0    OK to take with cymbalta.    Follow-up: Return in about 3 weeks (around 06/03/2020) for sleep and pain.   Time spent 25 minutes in encounter.  Nani Gasser, MD

## 2020-05-31 ENCOUNTER — Encounter: Payer: Self-pay | Admitting: Family Medicine

## 2020-05-31 MED ORDER — PANTOPRAZOLE SODIUM 40 MG PO TBEC: 40.0000 mg | DELAYED_RELEASE_TABLET | Freq: Every day | ORAL | 1 refills | Status: DC

## 2020-06-01 ENCOUNTER — Ambulatory Visit: Payer: Managed Care, Other (non HMO) | Admitting: Family Medicine

## 2020-06-04 ENCOUNTER — Other Ambulatory Visit: Payer: Self-pay | Admitting: Family Medicine

## 2020-07-01 ENCOUNTER — Ambulatory Visit: Payer: Managed Care, Other (non HMO) | Admitting: Family Medicine

## 2020-07-07 ENCOUNTER — Ambulatory Visit: Payer: Managed Care, Other (non HMO) | Admitting: Neurology

## 2020-07-13 ENCOUNTER — Telehealth: Payer: Self-pay | Admitting: Neurology

## 2020-07-13 NOTE — Telephone Encounter (Signed)
LMOVM, Sample will be in fridge for pt to pick up.  Medication Samples have been provided to the patient.  Drug name:Aimovig      Strength: 140       Qty:1 LOT: [id number] Exp.Date: 3/22  Dosing instructions: Inject medication subcutaneus every 28 days  The patient has been instructed regarding the correct time, dose, and frequency of taking this medication, including desired effects and most common side effects.   Denise Ramirez 8:11 AM 07/13/2020

## 2020-07-22 ENCOUNTER — Encounter: Payer: Self-pay | Admitting: Family Medicine

## 2020-07-22 NOTE — Telephone Encounter (Signed)
Patient called wanting to know if we received her MyChart.   I advised patient we did receive it and it has been sent to Dr Linford Arnold, however office protocol is that patients need to be seen for UTIs. Patient stated she feels it is the same as she had previously (in July). I advised her I do see where she was seen in July but also no culture was done and she really needs to have that done. 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

## 2020-07-23 MED ORDER — SULFAMETHOXAZOLE-TRIMETHOPRIM 800-160 MG PO TABS: 1.0000 | ORAL_TABLET | Freq: Two times a day (BID) | ORAL | 0 refills | Status: DC

## 2020-07-23 NOTE — Telephone Encounter (Signed)
Rx sent to preferred pharmacy.

## 2020-07-23 NOTE — Telephone Encounter (Signed)
Sent note. Just need her pharmacy close to her

## 2020-07-23 NOTE — Progress Notes (Signed)
Received fax from [facility] that Approval for Aimovig 140 was valid from 07/22/20 to 09/05/20. Supposedly they were missing info even though appr was in [facility].

## 2020-08-03 ENCOUNTER — Other Ambulatory Visit: Payer: Self-pay | Admitting: Family Medicine

## 2020-08-09 ENCOUNTER — Telehealth: Payer: Self-pay | Admitting: Emergency Medicine

## 2020-08-09 NOTE — Telephone Encounter (Signed)
Received fax from [facility] regarding patient's Aimovig PA.  She is a patient of Dr. Moises Blood office.  Faxed the PA to his office, @ [phone].

## 2020-08-16 NOTE — Progress Notes (Signed)
Denise Ramirez (Key: [id number]) Rx #: [phone] Aimovig 140MG /ML auto-injectors   Form [employer] Electronic Prior Authorization Form (2017 NCPDP) Created 3 days ago Sent to Plan 1 hour ago Plan Response 1 hour ago Submit Clinical Questions 42 minutes ago Determination Favorable 40 minutes ago Message from Plan Request Reference Number: [id number]. AIMOVIG INJ 140MG /ML is approved through 02/14/2021. Your patient may now fill this prescription and it will be covered.

## 2020-08-17 ENCOUNTER — Telehealth: Payer: Self-pay

## 2020-08-17 NOTE — Telephone Encounter (Signed)
Message recevied from Pt husband, Per Pt pharmacy pt needs a PA done. Pt had to pay of pocket for Aimovig       After reviewing pt chart, PA done 04/2020, approved until 02/14/21.    Lvm for pt, Aimovig Pa approved. Will ask Billing to check with insurance.

## 2020-08-17 NOTE — Telephone Encounter (Signed)
Correct! The pharm actually sent me one yesterday through cover my meds for her. For the Aimovig 140mg . I went ahead and completed it again since pharm was saying they couldn't see approval in the system and it was approved through 02/14/21. The pharm should now be able to see the Key in cover my meds since they created it. Should be no more confusion. Let me know if I need to do anything. Thanks!

## 2020-08-18 NOTE — Telephone Encounter (Signed)
LMOVM,stating Approval confirmed by our [facility]

## 2020-08-19 ENCOUNTER — Ambulatory Visit: Payer: Managed Care, Other (non HMO) | Admitting: Neurology

## 2020-10-12 IMAGING — CT CT CERVICAL SPINE W/O CM
3 of 4 series · 9 of 33 positions shown, 11 images · non-contrast
Comparison: None.

CLINICAL DATA: Two 40 pound pans fell on the patient's head at [DATE]
p.m. today. Headache and neck pain. Numbness in the left arm and
tingling in the right arm. Initial encounter.

EXAM:
CT HEAD WITHOUT CONTRAST
CT CERVICAL SPINE WITHOUT CONTRAST
TECHNIQUE: Multidetector CT imaging of the head and cervical spine was
performed following the standard protocol without intravenous
contrast. Multiplanar CT image reconstructions of the cervical spine
were also generated.

[Series 6: orthogonal bone · axial · 0.21mm/px · z∈[+627,+627]mm · 1 of 113 slices shown, 2 images]
[im 65/113  soft-tissue]
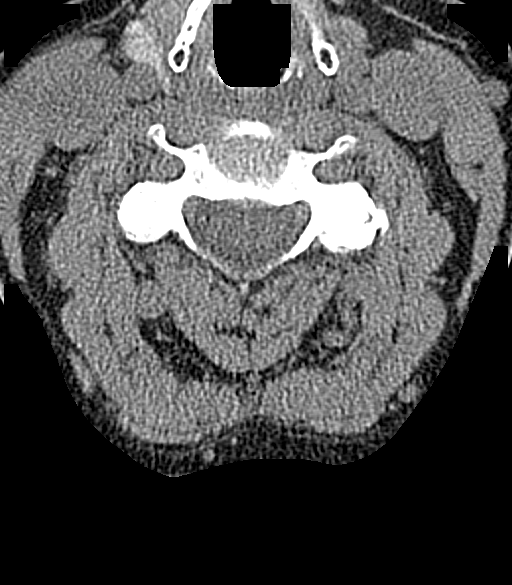
[im 65/113  bone]
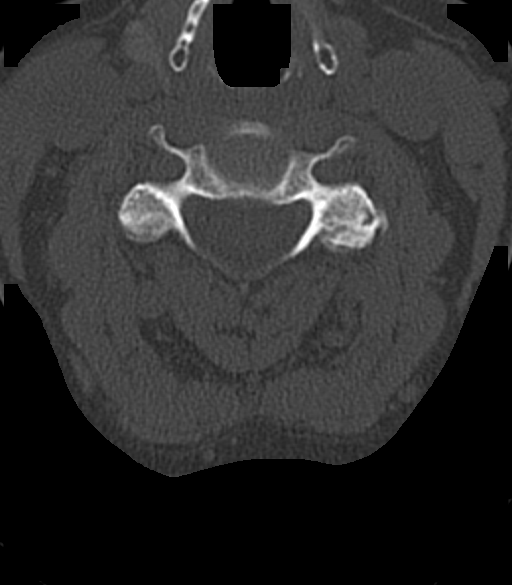

[Series 7: coronal bone · coronal · 0.21mm/px · 3 of 61 slices shown]
[im 15/61  bone]
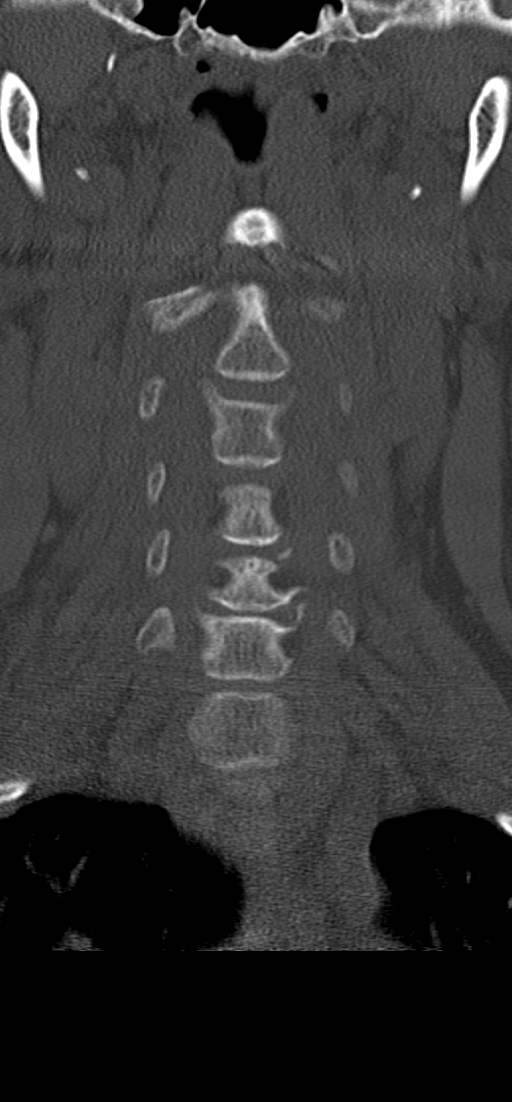
[im 25/61  bone]
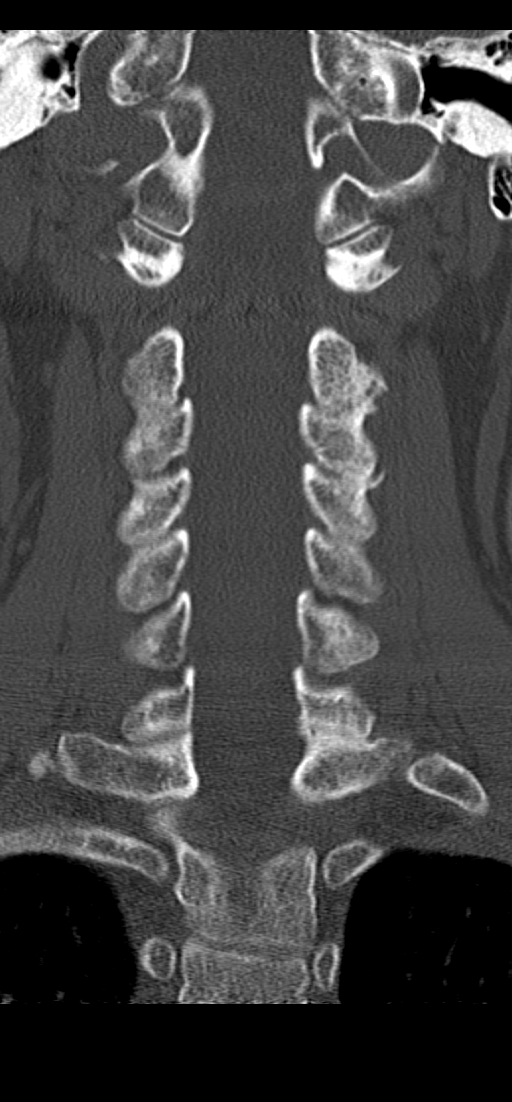
[im 36/61  bone]
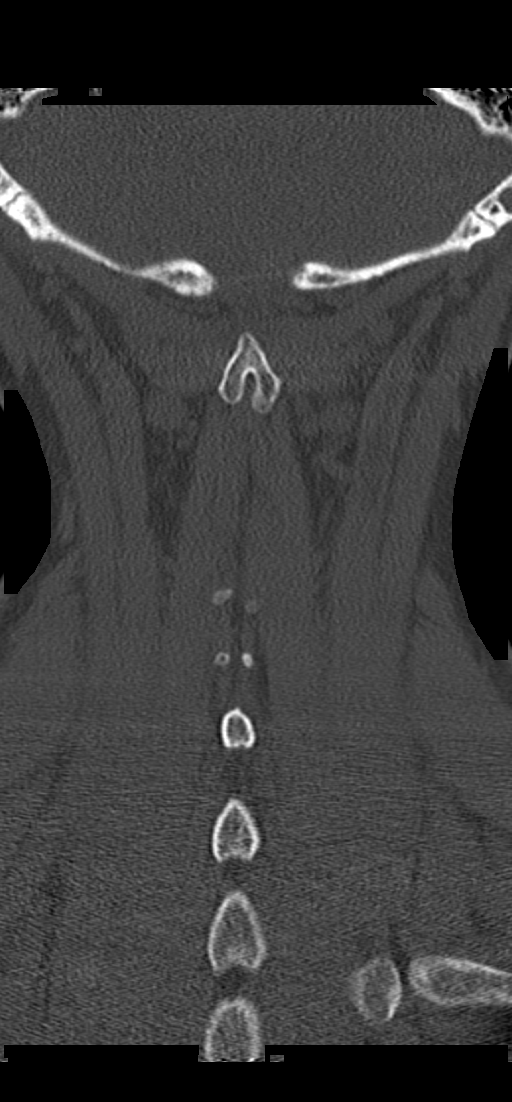

[Series 8: sagittal bone · sagittal · 0.23mm/px · 5 of 53 slices shown, 6 images]
[im 18/53  bone]
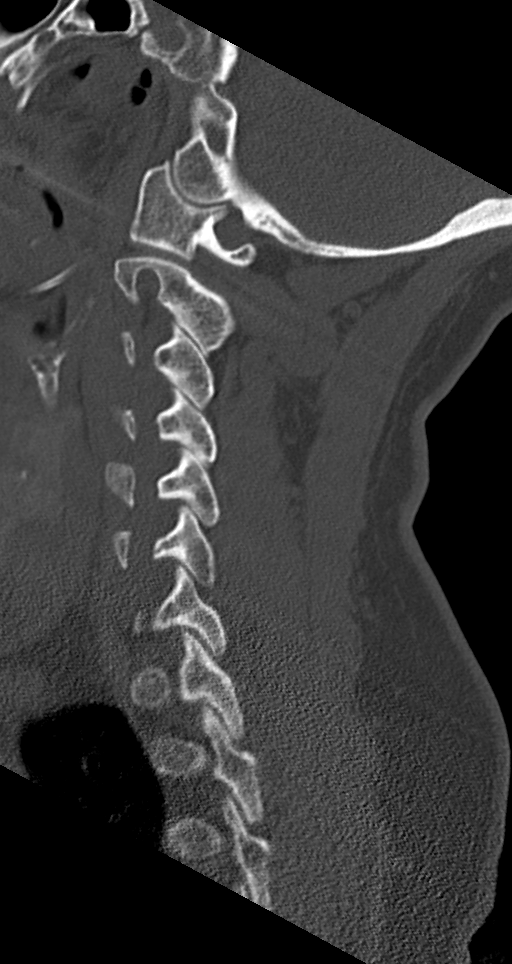
[im 22/53  bone]
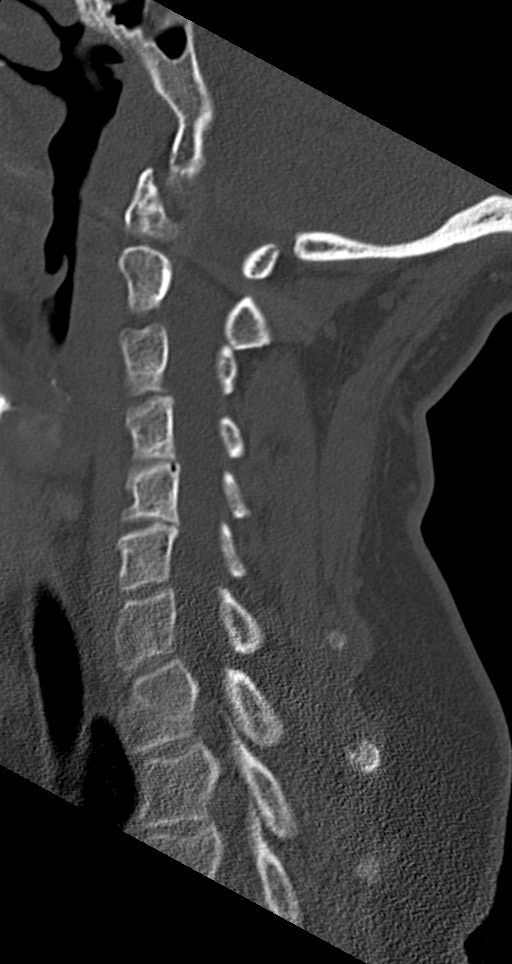
[im 27/53  soft-tissue]
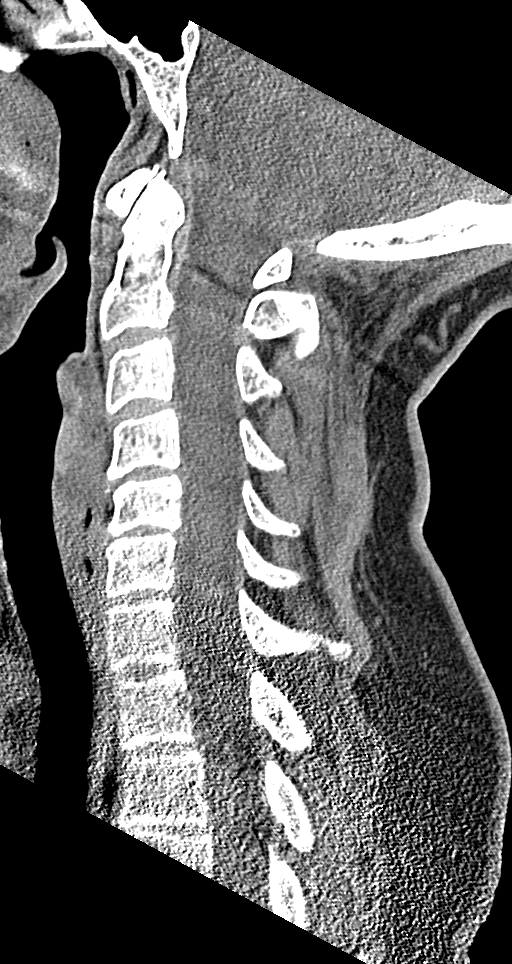
[im 27/53  bone]
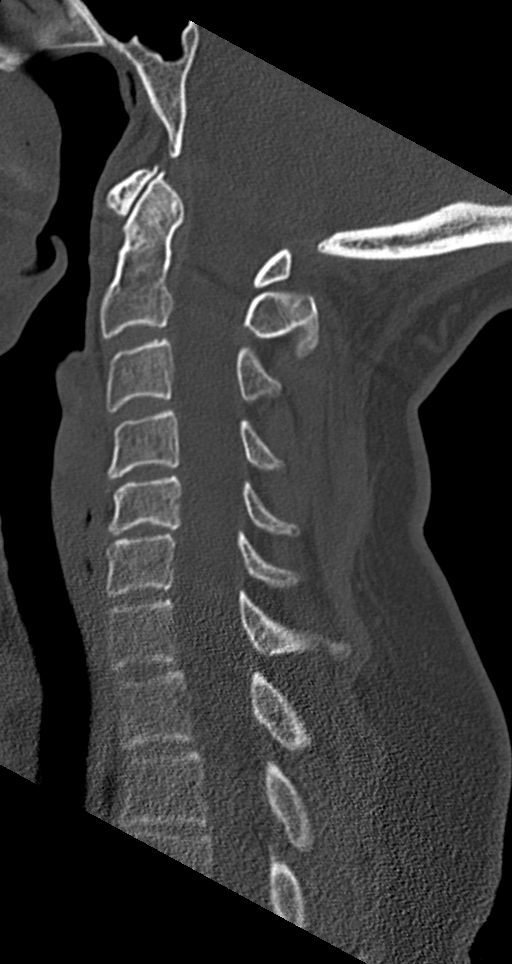
[im 31/53  bone]
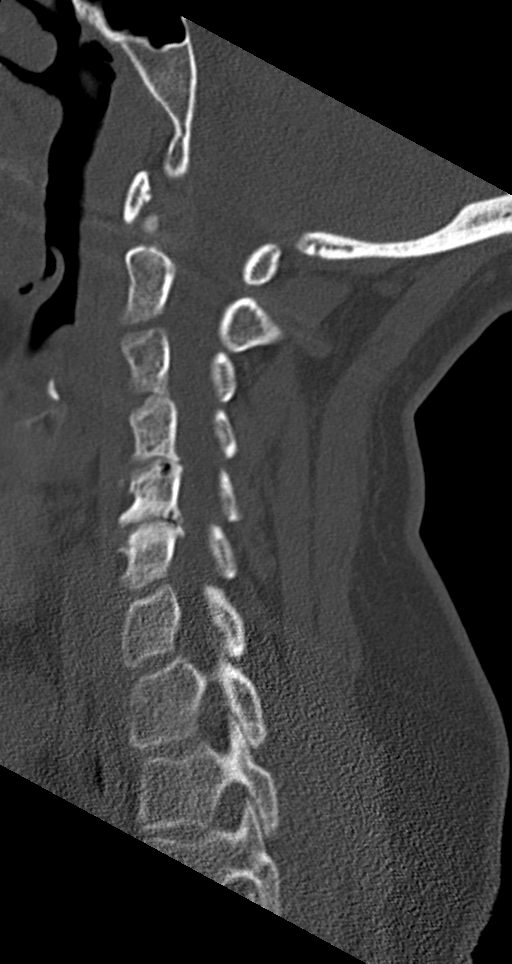
[im 35/53  bone]
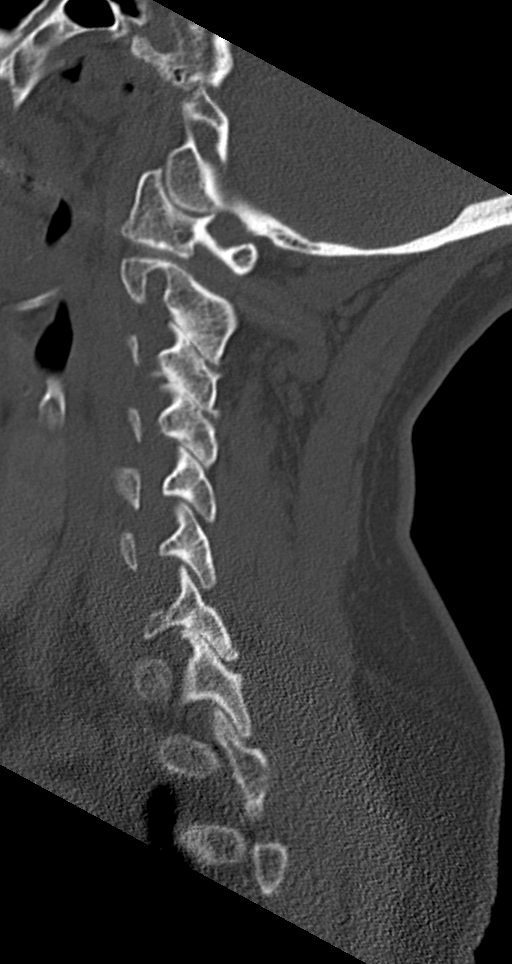

[9 of 33 positions shown; findings below may reference images not displayed]

FINDINGS: CT HEAD FINDINGS

Brain: No evidence of acute infarction, hemorrhage, hydrocephalus,
extra-axial collection or mass lesion/mass effect.

Vascular: No hyperdense vessel or unexpected calcification.

Skull: Intact.  No focal lesion.

Sinuses/Orbits: Negative.

Other: None.

CT CERVICAL SPINE FINDINGS

Alignment: Maintained with straightening of lordosis noted.

Skull base and vertebrae: No acute fracture. No primary bone lesion
or focal pathologic process.

Soft tissues and spinal canal: No prevertebral fluid or swelling. No
visible canal hematoma.

Disc levels: Mild loss of disc space height and endplate spurring
are seen at C5-6.

Upper chest: Negative.

Other: None.
IMPRESSION: Negative head CT.

No acute abnormality cervical spine.

Mild appearing degenerative disc disease C5-6.

## 2020-10-12 IMAGING — CT CT HEAD W/O CM
3 series · 15 of 47 positions shown, 18 images · non-contrast
Comparison: None.

CLINICAL DATA: Two 40 pound pans fell on the patient's head at [DATE]
p.m. today. Headache and neck pain. Numbness in the left arm and
tingling in the right arm. Initial encounter.

EXAM:
CT HEAD WITHOUT CONTRAST
CT CERVICAL SPINE WITHOUT CONTRAST
TECHNIQUE: Multidetector CT imaging of the head and cervical spine was
performed following the standard protocol without intravenous
contrast. Multiplanar CT image reconstructions of the cervical spine
were also generated.

[Series 3: head wo · axial · 0.43mm/px · z∈[+722,+862]mm · 9 of 34 slices shown, 12 images]
[im 3/34  brain]
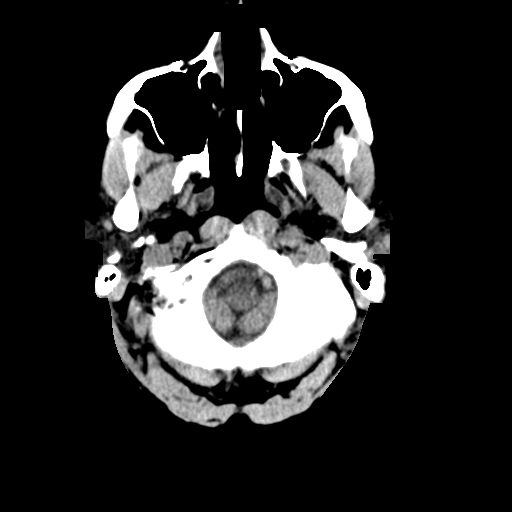
[im 3/34  bone]
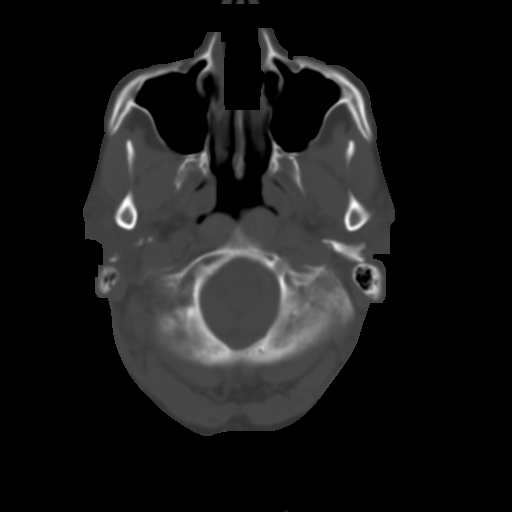
[im 6/34  brain]
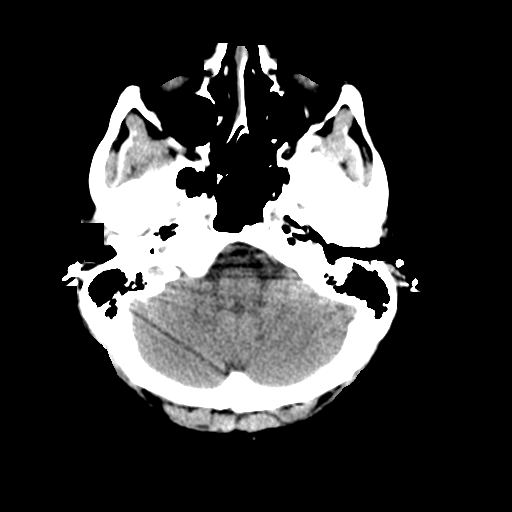
[im 10/34  brain]
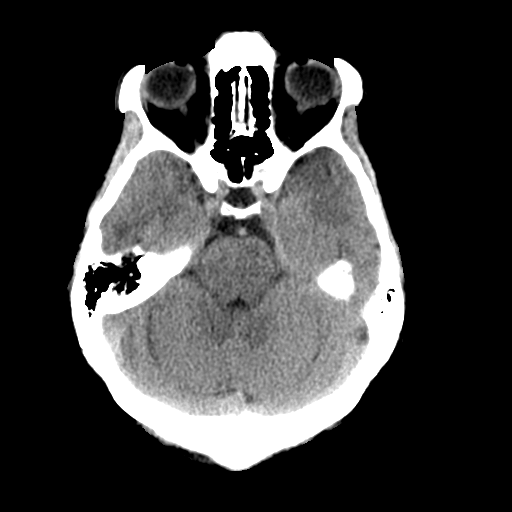
[im 13/34  brain]
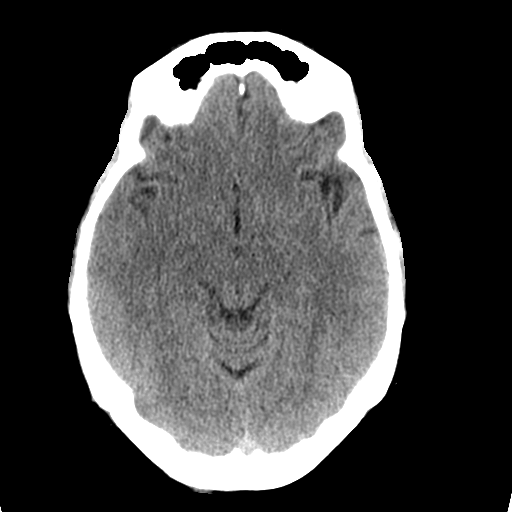
[im 18/34  brain]
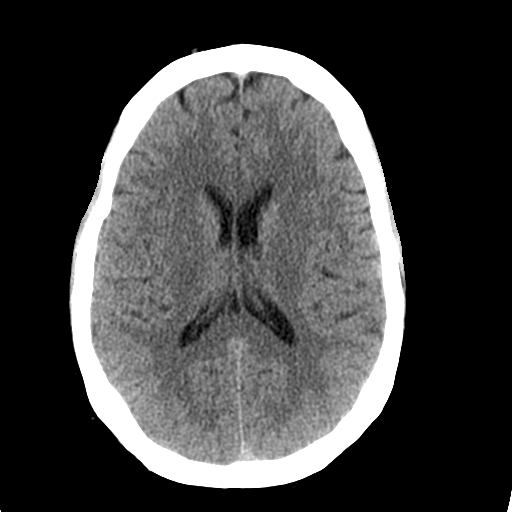
[im 18/34  bone]
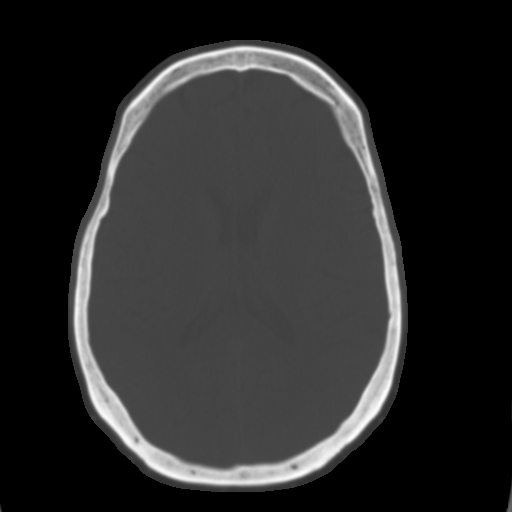
[im 21/34  brain]
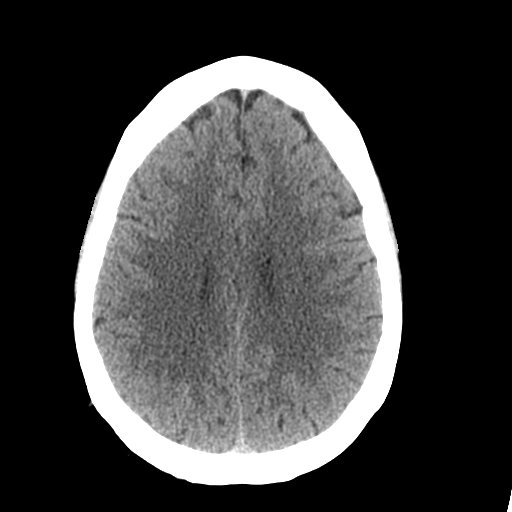
[im 24/34  brain]
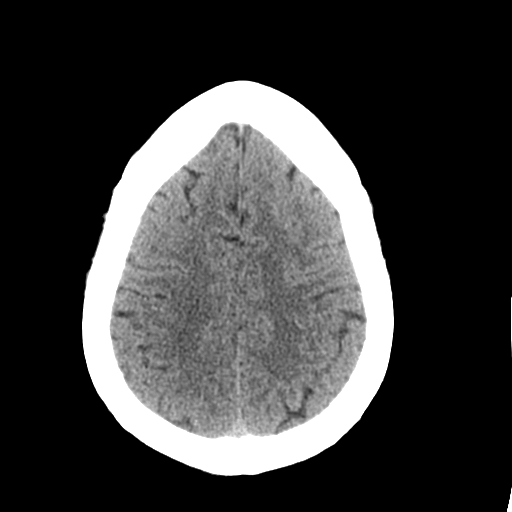
[im 28/34  brain]
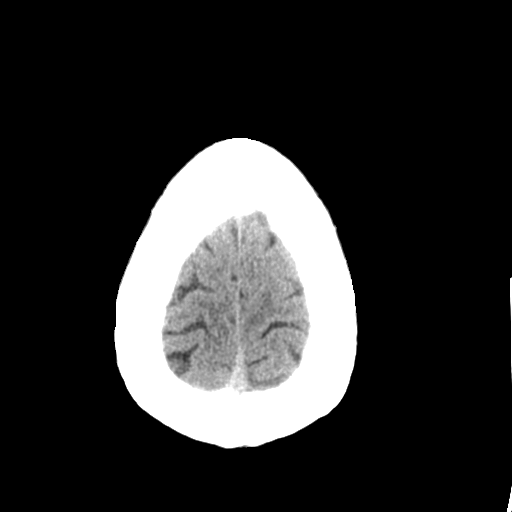
[im 31/34  brain]
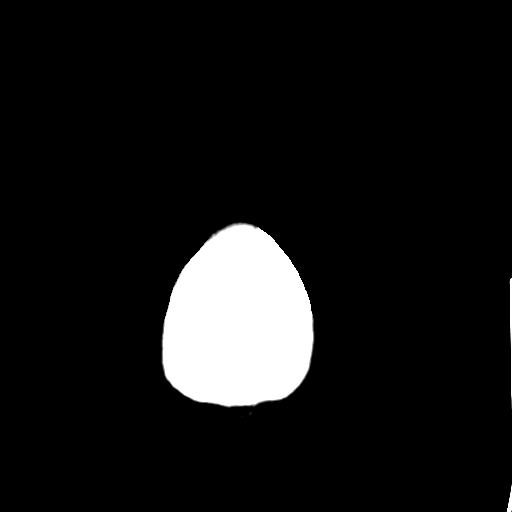
[im 31/34  bone]
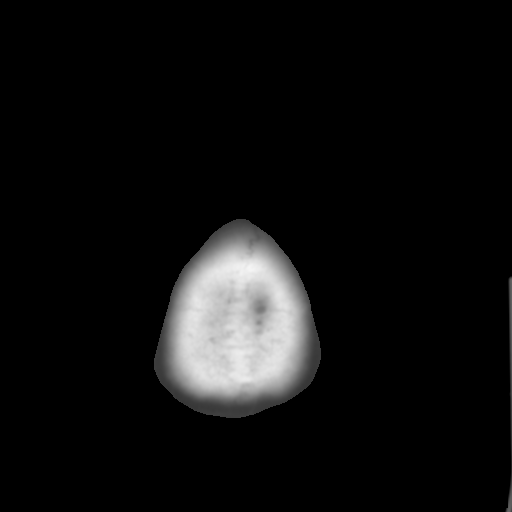

[Series 6: coronal soft tissue · coronal · 0.33mm/px · 3 of 69 slices shown]
[im 25/69  brain]
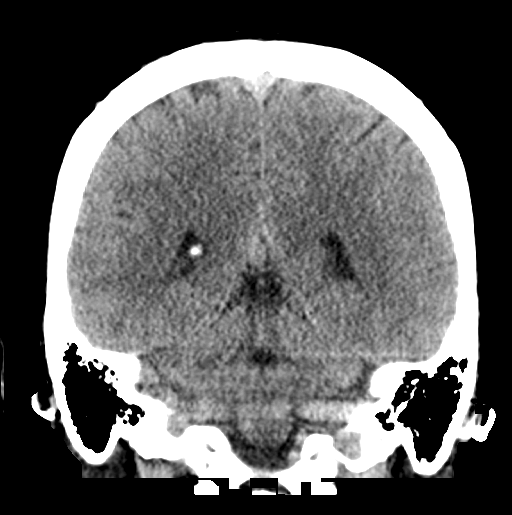
[im 31/69  brain]
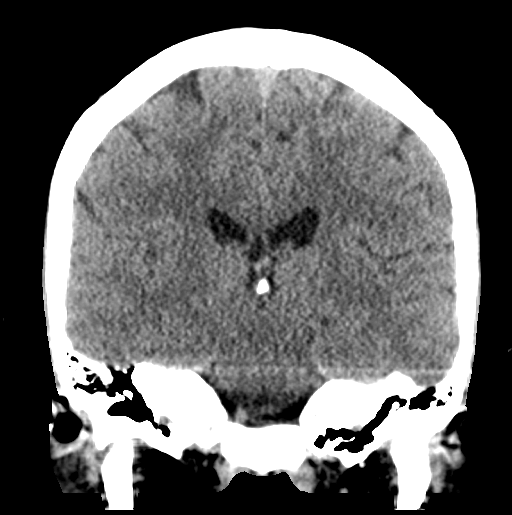
[im 38/69  brain]
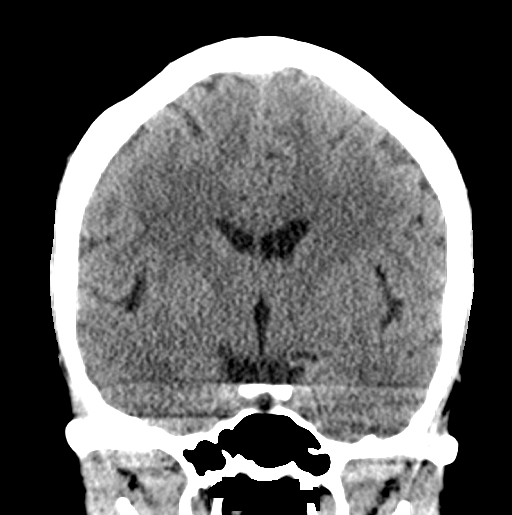

[Series 7: sagittal soft tissue · sagittal · 0.33mm/px · 3 of 57 slices shown]
[im 19/57  brain]
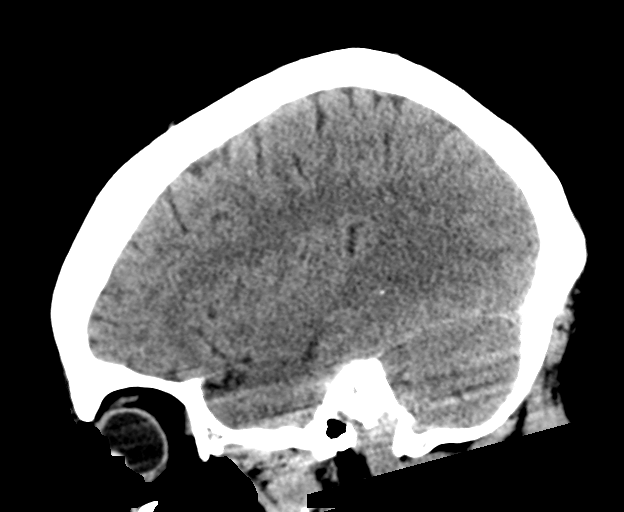
[im 29/57  brain]
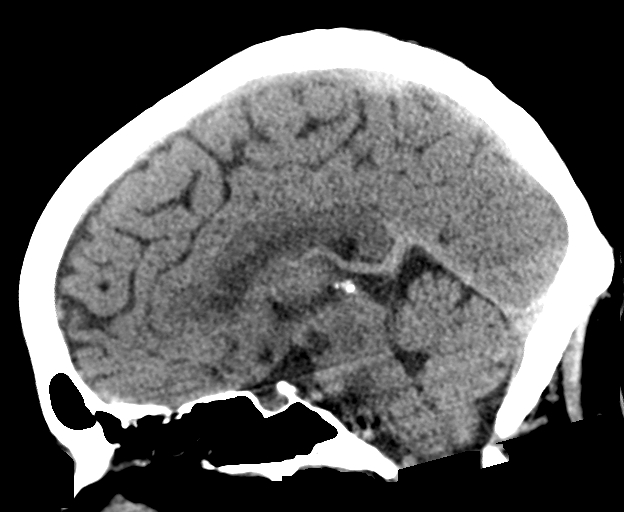
[im 38/57  brain]
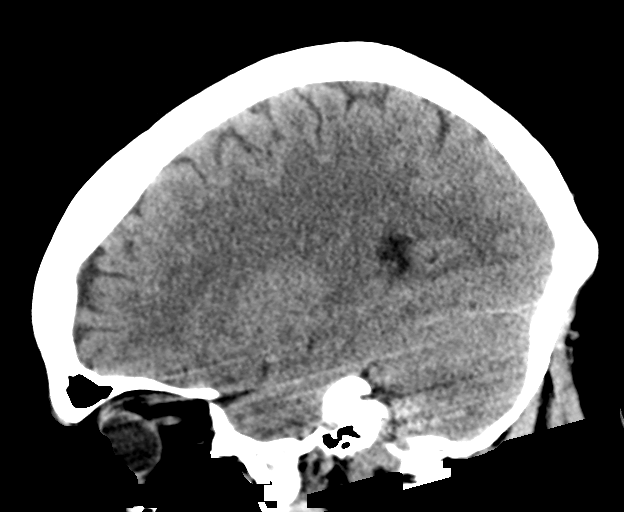

[15 of 47 positions shown; findings below may reference images not displayed]

FINDINGS: CT HEAD FINDINGS

Brain: No evidence of acute infarction, hemorrhage, hydrocephalus,
extra-axial collection or mass lesion/mass effect.

Vascular: No hyperdense vessel or unexpected calcification.

Skull: Intact.  No focal lesion.

Sinuses/Orbits: Negative.

Other: None.

CT CERVICAL SPINE FINDINGS

Alignment: Maintained with straightening of lordosis noted.

Skull base and vertebrae: No acute fracture. No primary bone lesion
or focal pathologic process.

Soft tissues and spinal canal: No prevertebral fluid or swelling. No
visible canal hematoma.

Disc levels: Mild loss of disc space height and endplate spurring
are seen at C5-6.

Upper chest: Negative.

Other: None.
IMPRESSION: Negative head CT.

No acute abnormality cervical spine.

Mild appearing degenerative disc disease C5-6.

## 2020-11-05 ENCOUNTER — Encounter: Payer: Self-pay | Admitting: Neurology

## 2020-11-05 ENCOUNTER — Telehealth: Payer: Self-pay | Admitting: Neurology

## 2020-11-05 NOTE — Progress Notes (Signed)
Desma Maxim (Key: [id number]) Aimovig 140MG /ML auto-injectors   Form [employer] Electronic Prior Authorization Form (2017 NCPDP) Created 9 minutes ago Sent to Plan 8 minutes ago Plan Response 7 minutes ago Submit Clinical Questions 3 minutes ago Determination Favorable less than a minute ago Message from Plan Request Reference Number: [id number]. AIMOVIG INJ 140MG /ML is approved through 05/05/2021. Your patient may now fill this prescription and it will be covered.

## 2020-11-05 NOTE — Telephone Encounter (Signed)
I called patient's pharmacy in [place] and gave them the new PA information. I also called husband back and let him know that I called the pharmacy and she was running the PA info.   This has been taken care of. Thanks!

## 2020-11-05 NOTE — Telephone Encounter (Signed)
Patient husband states that the patient has had a migraine for several days now and the insurance is stating that the amivog needs a prior auth and we have done that already per patient husband.   They need  To speak to someone about what they can do to help her until this is fixed. I put husband back to [employer] so he could leave a detail message about the prior auth for her. Please call patient back   [facility] @ [phone]

## 2020-11-28 ENCOUNTER — Other Ambulatory Visit: Payer: Self-pay | Admitting: Neurology

## 2020-12-03 ENCOUNTER — Other Ambulatory Visit: Payer: Self-pay | Admitting: Family Medicine

## 2021-02-26 ENCOUNTER — Other Ambulatory Visit: Payer: Self-pay | Admitting: Neurology

## 2021-02-28 NOTE — Telephone Encounter (Signed)
Last OV--02/27/20 Last refill--11/29/20

## 2021-03-14 ENCOUNTER — Telehealth: Payer: Self-pay | Admitting: Neurology

## 2021-03-14 NOTE — Telephone Encounter (Signed)
[place] and can not get in with a new neurologist until to the end of July. She would like to know if we can call in a RX for 3 months for the [employer] [phone]

## 2021-03-15 MED ORDER — AIMOVIG 140 MG/ML ~~LOC~~ SOAJ: SUBCUTANEOUS | 2 refills | Status: DC

## 2021-03-15 NOTE — Telephone Encounter (Signed)
LMOVM refills sent to the pharmacy 

## 2021-03-15 NOTE — Telephone Encounter (Signed)
OK to refill until July

## 2021-05-19 ENCOUNTER — Telehealth (INDEPENDENT_AMBULATORY_CARE_PROVIDER_SITE_OTHER): Payer: 59 | Admitting: Family Medicine

## 2021-05-19 ENCOUNTER — Encounter: Payer: Self-pay | Admitting: Family Medicine

## 2021-05-19 VITALS — BP 121/78 | Ht 68.0 in | Wt 208.0 lb

## 2021-05-19 DIAGNOSIS — Z9181 History of falling: Secondary | ICD-10-CM | POA: Diagnosis not present

## 2021-05-19 DIAGNOSIS — G43809 Other migraine, not intractable, without status migrainosus: Secondary | ICD-10-CM | POA: Diagnosis not present

## 2021-05-19 DIAGNOSIS — M5136 Other intervertebral disc degeneration, lumbar region: Secondary | ICD-10-CM | POA: Diagnosis not present

## 2021-05-19 DIAGNOSIS — M51369 Other intervertebral disc degeneration, lumbar region without mention of lumbar back pain or lower extremity pain: Secondary | ICD-10-CM

## 2021-05-19 DIAGNOSIS — M797 Fibromyalgia: Secondary | ICD-10-CM

## 2021-05-19 MED ORDER — AIMOVIG 140 MG/ML ~~LOC~~ SOAJ: SUBCUTANEOUS | 12 refills | Status: AC

## 2021-05-19 MED ORDER — DULOXETINE HCL 20 MG PO CPEP: 40.0000 mg | ORAL_CAPSULE | Freq: Every day | ORAL | 3 refills | Status: AC

## 2021-05-19 MED ORDER — PANTOPRAZOLE SODIUM 40 MG PO TBEC: 40.0000 mg | DELAYED_RELEASE_TABLET | Freq: Every day | ORAL | 1 refills | Status: AC

## 2021-05-19 NOTE — Assessment & Plan Note (Signed)
Has had epidurals and getting SIj oint injections next week.

## 2021-05-19 NOTE — Assessment & Plan Note (Signed)
Doing well on the Aimovig and would like to continue it.  She was due on Monday for her injection.

## 2021-05-19 NOTE — Progress Notes (Signed)
Virtual Visit via Telephone Note  I connected with Denise Ramirez on 05/20/21 at  9:50 AM EDT by telephone and verified that I am speaking with the correct person using two identifiers.   I discussed the limitations, risks, security and privacy concerns of performing an evaluation and management service by telephone and the availability of in person appointments. I also discussed with the patient that there may be a patient responsible charge related to this service. The patient expressed understanding and agreed to proceed.  Patient location: at home Provider loccation: In office   Subjective:    CC: Follow up.   HPI: Migraine HA - Previously seeing Dr. Everlena Cooper. Doing well on Aimovg. She can tell when the medication is starting to wear off.    She fell off a ladder at work several week or so ago.  Fell onto her back. Dx with crush fracture of L3. Has had an epidural for tx. Also getting her SI joint injections next week. Using meloxicam.   Had decided not to have the C5 surgery from her work injury a couple of years ago. Had MRI scheduled later this week.  Has been going to PT for the last 2 mo.    Past medical history, Surgical history, Family history not pertinant except as noted below, Social history, Allergies, and medications have been entered into the medical record, reviewed, and corrections made.   Review of Systems: No fevers, chills, night sweats, weight loss, chest pain, or shortness of breath.   Objective:    General: Speaking clearly in complete sentences without any shortness of breath.  Alert and oriented x3.  Normal judgment. No apparent acute distress.    Impression and Recommendations:    Migraine headache Doing well on the Aimovig and would like to continue it.  She was due on Monday for her injection.   DDD (degenerative disc disease), lumbar Has had epidurals and getting SIj oint injections next week.   Fibromyalgia Doing well on cymbalta. Will refill  medication  Recent Fall_ working with ortho and undergoing PT.   Would like get labs this fall.    I discussed the assessment and treatment plan with the patient. The patient was provided an opportunity to ask questions and all were answered. The patient agreed with the plan and demonstrated an understanding of the instructions.   The patient was advised to call back or seek an in-person evaluation if the symptoms worsen or if the condition fails to improve as anticipated.  I provided 20 minutes of non-face-to-face time during this encounter.   Nani Gasser, MD

## 2021-05-20 NOTE — Assessment & Plan Note (Signed)
Doing well on cymbalta. Will refill medication

## 2021-05-27 ENCOUNTER — Telehealth: Payer: Self-pay

## 2021-05-27 NOTE — Telephone Encounter (Signed)
Prior Authorization started for Aimovig through CoverMyMeds.

## 2021-05-30 NOTE — Telephone Encounter (Signed)
Medication: Aimovig 140 mg/mL injection  Prior authorization determination received Medication has been approved

## 2021-07-14 ENCOUNTER — Encounter: Payer: Self-pay | Admitting: Family Medicine

## 2021-07-14 DIAGNOSIS — E785 Hyperlipidemia, unspecified: Secondary | ICD-10-CM

## 2021-07-14 DIAGNOSIS — G43809 Other migraine, not intractable, without status migrainosus: Secondary | ICD-10-CM

## 2021-07-14 NOTE — Telephone Encounter (Signed)
Labs ordered. Pls fax.

## 2021-07-29 ENCOUNTER — Other Ambulatory Visit: Payer: Self-pay | Admitting: Family Medicine

## 2021-08-04 ENCOUNTER — Other Ambulatory Visit: Payer: Self-pay

## 2021-08-04 DIAGNOSIS — Z0000 Encounter for general adult medical examination without abnormal findings: Secondary | ICD-10-CM

## 2021-08-04 DIAGNOSIS — E785 Hyperlipidemia, unspecified: Secondary | ICD-10-CM

## 2021-08-04 NOTE — Progress Notes (Signed)
Received MyChart message from pt's spouse:     Labs (Newest Message First) [facility] Clinical Pool (supporting Agapito Games, MD) 1 hour ago (11:40 AM)   Hi Dr. Judie Petit!   [name] and I haven't been able to coordinate a time at [employer]. Could you possibly resend the orders to:   [facility] located in [place]? Their fax number is [phone].   Thanks and Happy Fall!   Matt   Labs reordered and faxed to [facility] at number provided by spouse.  Tiajuana Amass, CMA

## 2021-09-20 LAB — LIPID PANEL
Cholesterol: 230 — AB (ref 0–200)
HDL: 57 (ref 35–70)
LDL Cholesterol: 144
LDl/HDL Ratio: 4
Triglycerides: 147 (ref 40–160)

## 2021-09-20 LAB — BASIC METABOLIC PANEL
BUN: 13 (ref 4–21)
CO2: 29 — AB (ref 13–22)
Chloride: 103 (ref 99–108)
Creatinine: 0.6 (ref 0.5–1.1)
Glucose: 101
Potassium: 4.8 (ref 3.4–5.3)
Sodium: 137 (ref 137–147)

## 2021-09-20 LAB — CBC: RBC: 4.53 (ref 3.87–5.11)

## 2021-09-20 LAB — CBC AND DIFFERENTIAL
HCT: 43 (ref 36–46)
Hemoglobin: 13.6 (ref 12.0–16.0)
Platelets: 285 (ref 150–399)
WBC: 4.8

## 2021-09-20 LAB — HEPATIC FUNCTION PANEL
ALT: 20 (ref 7–35)
AST: 29 (ref 13–35)
Alkaline Phosphatase: 69 (ref 25–125)
Bilirubin, Total: 0.5

## 2021-09-20 LAB — COMPREHENSIVE METABOLIC PANEL
Albumin: 4.6 (ref 3.5–5.0)
Calcium: 9.7 (ref 8.7–10.7)
Globulin: 2.6

## 2021-09-21 ENCOUNTER — Telehealth: Payer: Self-pay | Admitting: Family Medicine

## 2021-09-21 NOTE — Telephone Encounter (Signed)
Mychart sent.

## 2021-09-28 ENCOUNTER — Encounter: Payer: Self-pay | Admitting: Family Medicine
